# Patient Record
Sex: Male | Born: 1950 | Race: White | Hispanic: No | Smoking: Former smoker
Health system: Southern US, Community
[De-identification: ages and names within clinical notes are randomized; demographics above are authoritative.]

## PROBLEM LIST (undated history)

## (undated) DIAGNOSIS — E785 Hyperlipidemia, unspecified: Secondary | ICD-10-CM

## (undated) DIAGNOSIS — K219 Gastro-esophageal reflux disease without esophagitis: Secondary | ICD-10-CM

## (undated) DIAGNOSIS — G35 Multiple sclerosis: Secondary | ICD-10-CM

## (undated) DIAGNOSIS — G35D Multiple sclerosis, unspecified: Secondary | ICD-10-CM

## (undated) DIAGNOSIS — M1990 Unspecified osteoarthritis, unspecified site: Secondary | ICD-10-CM

## (undated) DIAGNOSIS — T7840XA Allergy, unspecified, initial encounter: Secondary | ICD-10-CM

## (undated) DIAGNOSIS — K5090 Crohn's disease, unspecified, without complications: Secondary | ICD-10-CM

## (undated) DIAGNOSIS — G709 Myoneural disorder, unspecified: Secondary | ICD-10-CM

## (undated) DIAGNOSIS — K5190 Ulcerative colitis, unspecified, without complications: Secondary | ICD-10-CM

## (undated) HISTORY — DX: Allergy, unspecified, initial encounter: T78.40XA

## (undated) HISTORY — PX: CERVICAL SPINE SURGERY: SHX589

## (undated) HISTORY — PX: THUMB ARTHROSCOPY: SHX2509

## (undated) HISTORY — PX: TONSILLECTOMY: SUR1361

## (undated) HISTORY — PX: ELBOW SURGERY: SHX618

## (undated) HISTORY — PX: SPINE SURGERY: SHX786

## (undated) HISTORY — DX: Hyperlipidemia, unspecified: E78.5

## (undated) HISTORY — PX: OTHER SURGICAL HISTORY: SHX169

## (undated) HISTORY — PX: BACK SURGERY: SHX140

---

## 2008-04-08 ENCOUNTER — Encounter: Admission: RE | Admit: 2008-04-08 | Discharge: 2008-04-08 | Payer: Self-pay | Admitting: Family Medicine

## 2008-04-19 ENCOUNTER — Encounter: Admission: RE | Admit: 2008-04-19 | Discharge: 2008-04-19 | Payer: Self-pay | Admitting: Family Medicine

## 2008-07-15 ENCOUNTER — Encounter: Admission: RE | Admit: 2008-07-15 | Discharge: 2008-07-15 | Payer: Self-pay | Admitting: Orthopedic Surgery

## 2008-07-18 ENCOUNTER — Encounter: Admission: RE | Admit: 2008-07-18 | Discharge: 2008-07-18 | Payer: Self-pay | Admitting: Orthopedic Surgery

## 2010-06-06 ENCOUNTER — Encounter: Payer: Self-pay | Admitting: Orthopedic Surgery

## 2012-07-18 ENCOUNTER — Other Ambulatory Visit: Payer: Self-pay | Admitting: Neurological Surgery

## 2012-07-18 DIAGNOSIS — M545 Low back pain, unspecified: Secondary | ICD-10-CM

## 2012-07-22 ENCOUNTER — Ambulatory Visit
Admission: RE | Admit: 2012-07-22 | Discharge: 2012-07-22 | Disposition: A | Discharge status: Home / Self Care | Payer: 59 | Source: Ambulatory Visit | Attending: Neurological Surgery | Admitting: Neurological Surgery

## 2012-07-22 DIAGNOSIS — M545 Low back pain, unspecified: Secondary | ICD-10-CM

## 2012-08-02 ENCOUNTER — Ambulatory Visit: Payer: 59 | Attending: Neurological Surgery | Admitting: Physical Therapy

## 2012-08-02 DIAGNOSIS — M545 Low back pain, unspecified: Secondary | ICD-10-CM | POA: Insufficient documentation

## 2012-08-02 DIAGNOSIS — IMO0001 Reserved for inherently not codable concepts without codable children: Secondary | ICD-10-CM | POA: Insufficient documentation

## 2012-08-02 DIAGNOSIS — M6281 Muscle weakness (generalized): Secondary | ICD-10-CM | POA: Insufficient documentation

## 2012-08-13 ENCOUNTER — Ambulatory Visit: Payer: 59 | Admitting: Physical Therapy

## 2012-08-16 ENCOUNTER — Encounter: Payer: 59 | Admitting: Physical Therapy

## 2012-08-27 ENCOUNTER — Encounter: Payer: 59 | Admitting: Physical Therapy

## 2012-08-30 ENCOUNTER — Encounter: Payer: 59 | Admitting: Physical Therapy

## 2012-09-10 DIAGNOSIS — Z9225 Personal history of immunosupression therapy: Secondary | ICD-10-CM | POA: Insufficient documentation

## 2013-01-24 ENCOUNTER — Other Ambulatory Visit: Payer: Self-pay | Admitting: Neurological Surgery

## 2013-01-24 DIAGNOSIS — M47812 Spondylosis without myelopathy or radiculopathy, cervical region: Secondary | ICD-10-CM

## 2013-01-25 ENCOUNTER — Ambulatory Visit
Admission: RE | Admit: 2013-01-25 | Discharge: 2013-01-25 | Disposition: A | Discharge status: Home / Self Care | Payer: 59 | Source: Ambulatory Visit | Attending: Neurological Surgery | Admitting: Neurological Surgery

## 2013-01-25 DIAGNOSIS — M47812 Spondylosis without myelopathy or radiculopathy, cervical region: Secondary | ICD-10-CM

## 2013-05-22 ENCOUNTER — Ambulatory Visit (HOSPITAL_COMMUNITY): Payer: 59 | Admitting: Licensed Clinical Social Worker

## 2013-06-28 ENCOUNTER — Other Ambulatory Visit: Payer: Self-pay | Admitting: Neurological Surgery

## 2013-07-03 ENCOUNTER — Encounter (HOSPITAL_COMMUNITY): Payer: Self-pay | Admitting: Pharmacy Technician

## 2013-07-05 NOTE — Pre-Procedure Instructions (Addendum)
Paul Fowler  07/05/2013   Your procedure is scheduled on:  Thursday, July 11, 2013 at 1:10 PM   Report to [facility] Short Stay (use Main Entrance "A'') at 11:10 AM.  Call this number if you have problems the morning of surgery: [phone]   Remember:   Do not eat food or drink liquids after midnight.   Take these medicines the morning of surgery with A SIP OF WATER: buPROPion (WELLBUTRIN XL), escitalopram (LEXAPRO), mesalamine (LIALDA), if needed:HYDROcodone-acetaminophen (NORCO) for pain, ranitidine (ZANTAC) for heartburn Stop taking Aspirin, vitamins and herbal medications. Do not take any NSAIDs ie: Ibuprofen, Advil, Naproxen or any medication containing Aspirin. Stop 5 days prior to procedure.  Do not wear jewelry, make-up or nail polish.  Do not wear lotions, powders, or perfumes. You may wear deodorant.  Do not shave 48 hours prior to surgery. Men may shave face and neck.  Do not bring valuables to the hospital.  [facility] is not responsible  for any belongings or valuables.               Contacts, dentures or bridgework may not be worn into surgery.  Leave suitcase in the car. After surgery it may be brought to your room.  For patients admitted to the hospital, discharge time is determined by your treatment team.               Patients discharged the day of surgery will not be allowed to drive home.   Name and phone number of your driver:                                                       Special Instructions: Please read [facility] - Preparing for Surgery

## 2013-07-08 ENCOUNTER — Encounter (HOSPITAL_COMMUNITY)
Admission: RE | Admit: 2013-07-08 | Discharge: 2013-07-08 | Disposition: A | Discharge status: Home / Self Care | Payer: 59 | Source: Ambulatory Visit | Attending: Neurological Surgery | Admitting: Neurological Surgery

## 2013-07-08 ENCOUNTER — Ambulatory Visit (HOSPITAL_COMMUNITY)
Admission: RE | Admit: 2013-07-08 | Discharge: 2013-07-08 | Disposition: A | Discharge status: Home / Self Care | Payer: 59 | Source: Ambulatory Visit | Attending: Neurological Surgery | Admitting: Neurological Surgery

## 2013-07-08 ENCOUNTER — Encounter (HOSPITAL_COMMUNITY): Payer: Self-pay

## 2013-07-08 DIAGNOSIS — Z01818 Encounter for other preprocedural examination: Secondary | ICD-10-CM | POA: Insufficient documentation

## 2013-07-08 DIAGNOSIS — Z01812 Encounter for preprocedural laboratory examination: Secondary | ICD-10-CM | POA: Insufficient documentation

## 2013-07-08 DIAGNOSIS — Q762 Congenital spondylolisthesis: Secondary | ICD-10-CM | POA: Insufficient documentation

## 2013-07-08 HISTORY — DX: Multiple sclerosis: G35

## 2013-07-08 HISTORY — DX: Ulcerative colitis, unspecified, without complications: K51.90

## 2013-07-08 HISTORY — DX: Unspecified osteoarthritis, unspecified site: M19.90

## 2013-07-08 HISTORY — DX: Multiple sclerosis, unspecified: G35.D

## 2013-07-08 HISTORY — DX: Crohn's disease, unspecified, without complications: K50.90

## 2013-07-08 HISTORY — DX: Gastro-esophageal reflux disease without esophagitis: K21.9

## 2013-07-08 HISTORY — DX: Myoneural disorder, unspecified: G70.9

## 2013-07-08 LAB — CBC WITH DIFFERENTIAL/PLATELET
BASOS ABS: 0.1 10*3/uL (ref 0.0–0.1)
Basophils Relative: 1 % (ref 0–1)
EOS ABS: 0.2 10*3/uL (ref 0.0–0.7)
Eosinophils Relative: 3 % (ref 0–5)
HEMATOCRIT: 43.4 % (ref 39.0–52.0)
Hemoglobin: 14.9 g/dL (ref 13.0–17.0)
LYMPHS ABS: 2.1 10*3/uL (ref 0.7–4.0)
Lymphocytes Relative: 30 % (ref 12–46)
MCH: 31.6 pg (ref 26.0–34.0)
MCHC: 34.3 g/dL (ref 30.0–36.0)
MCV: 92.1 fL (ref 78.0–100.0)
MONO ABS: 0.7 10*3/uL (ref 0.1–1.0)
Monocytes Relative: 10 % (ref 3–12)
NEUTROS ABS: 4 10*3/uL (ref 1.7–7.7)
Neutrophils Relative %: 57 % (ref 43–77)
PLATELETS: 250 10*3/uL (ref 150–400)
RBC: 4.71 MIL/uL (ref 4.22–5.81)
RDW: 12.7 % (ref 11.5–15.5)
WBC: 7 10*3/uL (ref 4.0–10.5)

## 2013-07-08 LAB — BASIC METABOLIC PANEL
BUN: 9 mg/dL (ref 6–23)
CO2: 28 mEq/L (ref 19–32)
Calcium: 9.6 mg/dL (ref 8.4–10.5)
Chloride: 101 mEq/L (ref 96–112)
Creatinine, Ser: 0.91 mg/dL (ref 0.50–1.35)
GFR calc Af Amer: >90 mL/min (ref >90)
GFR calc non Af Amer: 90 mL/min — ABNORMAL LOW (ref >90)
GLUCOSE: 86 mg/dL (ref 70–99)
Potassium: 4 mEq/L (ref 3.7–5.3)
Sodium: 141 mEq/L (ref 137–147)

## 2013-07-08 LAB — PROTIME-INR
INR: 0.98 (ref 0.00–1.49)
PROTHROMBIN TIME: 12.8 s (ref 11.6–15.2)

## 2013-07-08 LAB — SURGICAL PCR SCREEN
MRSA, PCR: NEGATIVE
Staphylococcus aureus: NEGATIVE

## 2013-07-08 LAB — TYPE AND SCREEN
ABO/RH(D): O POS
Antibody Screen: NEGATIVE

## 2013-07-08 LAB — ABO/RH: ABO/RH(D): O POS

## 2013-07-08 NOTE — Progress Notes (Addendum)
Patient had Echo, Stress test, ?ekg, and possibly CXR bacin in 2014.  He was having some SOB and wanted to get it checked out.  His PCP is Pearla Dubonnet  [phone].  I have requested that info from this office.  I elected not to do the EKG at PAT, as he thinks he had one alittle while ago. May have to do one  DOS.    DA

## 2013-07-10 MED ORDER — DEXAMETHASONE SODIUM PHOSPHATE 10 MG/ML IJ SOLN
10.0000 mg | INTRAMUSCULAR | Status: AC
  Administered 2013-07-11: 10 mg via INTRAVENOUS
  Filled 2013-07-10: qty 1

## 2013-07-10 MED ORDER — CEFAZOLIN SODIUM-DEXTROSE 2-3 GM-% IV SOLR
2.0000 g | INTRAVENOUS | Status: AC
  Administered 2013-07-11: 2 g via INTRAVENOUS
  Filled 2013-07-10: qty 50

## 2013-07-11 ENCOUNTER — Inpatient Hospital Stay (HOSPITAL_COMMUNITY)
Admission: RE | Admit: 2013-07-11 | Discharge: 2013-07-12 | DRG: 460 | Disposition: A | Discharge status: Home / Self Care | Payer: 59 | Source: Ambulatory Visit | Attending: Neurological Surgery | Admitting: Neurological Surgery

## 2013-07-11 ENCOUNTER — Inpatient Hospital Stay (HOSPITAL_COMMUNITY): Payer: 59

## 2013-07-11 ENCOUNTER — Inpatient Hospital Stay (HOSPITAL_COMMUNITY): Payer: 59 | Admitting: Anesthesiology

## 2013-07-11 ENCOUNTER — Encounter (HOSPITAL_COMMUNITY): Payer: 59 | Admitting: Anesthesiology

## 2013-07-11 ENCOUNTER — Encounter (HOSPITAL_COMMUNITY): Payer: Self-pay | Admitting: Anesthesiology

## 2013-07-11 ENCOUNTER — Encounter (HOSPITAL_COMMUNITY)
Admission: RE | Disposition: A | Discharge status: Home / Self Care | Payer: Self-pay | Source: Ambulatory Visit | Attending: Neurological Surgery

## 2013-07-11 DIAGNOSIS — Z981 Arthrodesis status: Secondary | ICD-10-CM

## 2013-07-11 DIAGNOSIS — M48061 Spinal stenosis, lumbar region without neurogenic claudication: Principal | ICD-10-CM | POA: Diagnosis present

## 2013-07-11 DIAGNOSIS — G35 Multiple sclerosis: Secondary | ICD-10-CM | POA: Diagnosis present

## 2013-07-11 DIAGNOSIS — M129 Arthropathy, unspecified: Secondary | ICD-10-CM | POA: Diagnosis present

## 2013-07-11 DIAGNOSIS — K219 Gastro-esophageal reflux disease without esophagitis: Secondary | ICD-10-CM | POA: Diagnosis present

## 2013-07-11 HISTORY — PX: MAXIMUM ACCESS (MAS)POSTERIOR LUMBAR INTERBODY FUSION (PLIF) 1 LEVEL: SHX6368

## 2013-07-11 SURGERY — FOR MAXIMUM ACCESS (MAS) POSTERIOR LUMBAR INTERBODY FUSION (PLIF) 1 LEVEL
Anesthesia: General | Site: Back

## 2013-07-11 MED ORDER — FENTANYL CITRATE 0.05 MG/ML IJ SOLN
INTRAMUSCULAR | Status: AC
  Filled 2013-07-11: qty 5

## 2013-07-11 MED ORDER — ACETAMINOPHEN 650 MG RE SUPP: 650.0000 mg | RECTAL | Status: DC | PRN

## 2013-07-11 MED ORDER — LACTATED RINGERS IV SOLN
INTRAVENOUS | Status: DC | PRN
  Administered 2013-07-11 (×2): via INTRAVENOUS

## 2013-07-11 MED ORDER — 0.9 % SODIUM CHLORIDE (POUR BTL) OPTIME
TOPICAL | Status: DC | PRN
  Administered 2013-07-11: 1000 mL

## 2013-07-11 MED ORDER — SODIUM CHLORIDE 0.9 % IJ SOLN: 3.0000 mL | INTRAMUSCULAR | Status: DC | PRN

## 2013-07-11 MED ORDER — GLYCOPYRROLATE 0.2 MG/ML IJ SOLN
INTRAMUSCULAR | Status: DC | PRN
  Administered 2013-07-11: .6 mg via INTRAVENOUS

## 2013-07-11 MED ORDER — ROCURONIUM BROMIDE 100 MG/10ML IV SOLN
INTRAVENOUS | Status: DC | PRN
  Administered 2013-07-11: 50 mg via INTRAVENOUS

## 2013-07-11 MED ORDER — PROPOFOL 10 MG/ML IV BOLUS
INTRAVENOUS | Status: DC | PRN
  Administered 2013-07-11: 200 mg via INTRAVENOUS

## 2013-07-11 MED ORDER — ONDANSETRON HCL 4 MG/2ML IJ SOLN
INTRAMUSCULAR | Status: DC | PRN
  Administered 2013-07-11: 4 mg via INTRAVENOUS

## 2013-07-11 MED ORDER — MIDAZOLAM HCL 2 MG/2ML IJ SOLN
INTRAMUSCULAR | Status: AC
  Filled 2013-07-11: qty 2

## 2013-07-11 MED ORDER — ACETAMINOPHEN 325 MG PO TABS: 650.0000 mg | ORAL_TABLET | ORAL | Status: DC | PRN

## 2013-07-11 MED ORDER — SODIUM CHLORIDE 0.9 % IN NEBU
INHALATION_SOLUTION | RESPIRATORY_TRACT | Status: AC
  Filled 2013-07-11: qty 3

## 2013-07-11 MED ORDER — SODIUM CHLORIDE 0.9 % IJ SOLN: 3.0000 mL | Freq: Two times a day (BID) | INTRAMUSCULAR | Status: DC

## 2013-07-11 MED ORDER — MIDAZOLAM HCL 5 MG/5ML IJ SOLN
INTRAMUSCULAR | Status: DC | PRN
  Administered 2013-07-11: 2 mg via INTRAVENOUS

## 2013-07-11 MED ORDER — NEOSTIGMINE METHYLSULFATE 1 MG/ML IJ SOLN
INTRAMUSCULAR | Status: DC | PRN
  Administered 2013-07-11: 3 mg via INTRAVENOUS

## 2013-07-11 MED ORDER — BUPROPION HCL ER (XL) 300 MG PO TB24
300.0000 mg | ORAL_TABLET | Freq: Every day | ORAL | Status: DC
  Administered 2013-07-12: 300 mg via ORAL
  Filled 2013-07-11: qty 1

## 2013-07-11 MED ORDER — OXYCODONE-ACETAMINOPHEN 5-325 MG PO TABS
1.0000 | ORAL_TABLET | ORAL | Status: DC | PRN
  Administered 2013-07-11 – 2013-07-12 (×3): 2 via ORAL
  Filled 2013-07-11 (×3): qty 2

## 2013-07-11 MED ORDER — LIDOCAINE HCL (CARDIAC) 20 MG/ML IV SOLN
INTRAVENOUS | Status: AC
  Filled 2013-07-11: qty 5

## 2013-07-11 MED ORDER — CARISOPRODOL 350 MG PO TABS
350.0000 mg | ORAL_TABLET | Freq: Two times a day (BID) | ORAL | Status: DC
Start: 2013-07-11 — End: 2013-07-12
  Administered 2013-07-11 – 2013-07-12 (×2): 350 mg via ORAL
  Filled 2013-07-11 (×2): qty 1

## 2013-07-11 MED ORDER — MORPHINE SULFATE 2 MG/ML IJ SOLN
1.0000 mg | INTRAMUSCULAR | Status: DC | PRN
  Administered 2013-07-11: 2 mg via INTRAVENOUS
  Administered 2013-07-11: 4 mg via INTRAVENOUS
  Administered 2013-07-11: 2 mg via INTRAVENOUS
  Filled 2013-07-11: qty 2

## 2013-07-11 MED ORDER — HYDROMORPHONE HCL PF 1 MG/ML IJ SOLN
0.2500 mg | INTRAMUSCULAR | Status: DC | PRN
  Administered 2013-07-11 (×4): 0.5 mg via INTRAVENOUS

## 2013-07-11 MED ORDER — SODIUM CHLORIDE 0.9 % IR SOLN
Status: DC | PRN
  Administered 2013-07-11: 14:00:00

## 2013-07-11 MED ORDER — GLYCOPYRROLATE 0.2 MG/ML IJ SOLN
INTRAMUSCULAR | Status: AC
  Filled 2013-07-11: qty 1

## 2013-07-11 MED ORDER — ONDANSETRON HCL 4 MG/2ML IJ SOLN
INTRAMUSCULAR | Status: AC
  Filled 2013-07-11: qty 2

## 2013-07-11 MED ORDER — OXYCODONE HCL 5 MG PO TABS
5.0000 mg | ORAL_TABLET | Freq: Once | ORAL | Status: AC | PRN
  Administered 2013-07-11: 5 mg via ORAL

## 2013-07-11 MED ORDER — OXYCODONE HCL 5 MG PO TABS
ORAL_TABLET | ORAL | Status: AC
  Filled 2013-07-11: qty 1

## 2013-07-11 MED ORDER — LIDOCAINE HCL (CARDIAC) 20 MG/ML IV SOLN
INTRAVENOUS | Status: DC | PRN
  Administered 2013-07-11: 70 mg via INTRAVENOUS

## 2013-07-11 MED ORDER — CELECOXIB 200 MG PO CAPS
200.0000 mg | ORAL_CAPSULE | Freq: Two times a day (BID) | ORAL | Status: DC
  Administered 2013-07-12: 200 mg via ORAL
  Filled 2013-07-11 (×3): qty 1

## 2013-07-11 MED ORDER — POTASSIUM CHLORIDE IN NACL 20-0.9 MEQ/L-% IV SOLN
INTRAVENOUS | Status: DC
  Administered 2013-07-11: 21:00:00 via INTRAVENOUS
  Filled 2013-07-11 (×3): qty 1000

## 2013-07-11 MED ORDER — NEOSTIGMINE METHYLSULFATE 1 MG/ML IJ SOLN
INTRAMUSCULAR | Status: AC
  Filled 2013-07-11: qty 10

## 2013-07-11 MED ORDER — ONDANSETRON HCL 4 MG/2ML IJ SOLN: 4.0000 mg | INTRAMUSCULAR | Status: DC | PRN

## 2013-07-11 MED ORDER — HYDROMORPHONE HCL PF 1 MG/ML IJ SOLN
INTRAMUSCULAR | Status: AC
Start: 2013-07-11 — End: 2013-07-12
  Filled 2013-07-11: qty 1

## 2013-07-11 MED ORDER — ARTIFICIAL TEARS OP OINT
TOPICAL_OINTMENT | OPHTHALMIC | Status: AC
  Filled 2013-07-11: qty 3.5

## 2013-07-11 MED ORDER — MENTHOL 3 MG MT LOZG: 1.0000 | LOZENGE | OROMUCOSAL | Status: DC | PRN

## 2013-07-11 MED ORDER — METOCLOPRAMIDE HCL 5 MG/ML IJ SOLN: 10.0000 mg | Freq: Once | INTRAMUSCULAR | Status: DC | PRN

## 2013-07-11 MED ORDER — SODIUM CHLORIDE 0.9 % IV SOLN: 250.0000 mL | INTRAVENOUS | Status: DC

## 2013-07-11 MED ORDER — LACTATED RINGERS IV SOLN
INTRAVENOUS | Status: DC
  Administered 2013-07-11: 11:00:00 via INTRAVENOUS

## 2013-07-11 MED ORDER — CEFAZOLIN SODIUM 1-5 GM-% IV SOLN
1.0000 g | Freq: Three times a day (TID) | INTRAVENOUS | Status: AC
  Administered 2013-07-11 – 2013-07-12 (×2): 1 g via INTRAVENOUS
  Filled 2013-07-11 (×2): qty 50

## 2013-07-11 MED ORDER — FENTANYL CITRATE 0.05 MG/ML IJ SOLN
INTRAMUSCULAR | Status: DC | PRN
  Administered 2013-07-11: 100 ug via INTRAVENOUS
  Administered 2013-07-11 (×3): 50 ug via INTRAVENOUS

## 2013-07-11 MED ORDER — BUPIVACAINE HCL (PF) 0.25 % IJ SOLN
INTRAMUSCULAR | Status: DC | PRN
Start: 2013-07-11 — End: 2013-07-11
  Administered 2013-07-11: 5 mL

## 2013-07-11 MED ORDER — PROPOFOL 10 MG/ML IV BOLUS
INTRAVENOUS | Status: AC
  Filled 2013-07-11: qty 20

## 2013-07-11 MED ORDER — THROMBIN 5000 UNITS EX SOLR
CUTANEOUS | Status: DC | PRN
  Administered 2013-07-11: 14:00:00 via TOPICAL

## 2013-07-11 MED ORDER — OXYCODONE HCL 5 MG/5ML PO SOLN: 5.0000 mg | Freq: Once | ORAL | Status: AC | PRN

## 2013-07-11 MED ORDER — PHENOL 1.4 % MT LIQD
1.0000 | OROMUCOSAL | Status: DC | PRN
Start: 2013-07-11 — End: 2013-07-12

## 2013-07-11 MED ORDER — THROMBIN 20000 UNITS EX SOLR
CUTANEOUS | Status: DC | PRN
  Administered 2013-07-11: 14:00:00 via TOPICAL

## 2013-07-11 MED ORDER — MORPHINE SULFATE 4 MG/ML IJ SOLN
INTRAMUSCULAR | Status: AC
  Administered 2013-07-11: 2 mg
  Filled 2013-07-11: qty 1

## 2013-07-11 MED ORDER — TRAZODONE HCL 100 MG PO TABS
100.0000 mg | ORAL_TABLET | Freq: Every day | ORAL | Status: DC
  Administered 2013-07-11: 100 mg via ORAL
  Filled 2013-07-11 (×2): qty 1

## 2013-07-11 MED ORDER — DEXAMETHASONE 4 MG PO TABS
4.0000 mg | ORAL_TABLET | Freq: Four times a day (QID) | ORAL | Status: DC
  Administered 2013-07-11 – 2013-07-12 (×3): 4 mg via ORAL
  Filled 2013-07-11 (×6): qty 1

## 2013-07-11 MED ORDER — MESALAMINE 1.2 G PO TBEC
2.4000 g | DELAYED_RELEASE_TABLET | Freq: Every day | ORAL | Status: DC
  Administered 2013-07-12: 2.4 g via ORAL
  Filled 2013-07-11 (×2): qty 2

## 2013-07-11 MED ORDER — HYDROMORPHONE HCL PF 1 MG/ML IJ SOLN
INTRAMUSCULAR | Status: AC
  Filled 2013-07-11: qty 1

## 2013-07-11 MED ORDER — ROCURONIUM BROMIDE 50 MG/5ML IV SOLN
INTRAVENOUS | Status: AC
  Filled 2013-07-11: qty 1

## 2013-07-11 MED ORDER — ESCITALOPRAM OXALATE 10 MG PO TABS
10.0000 mg | ORAL_TABLET | Freq: Every day | ORAL | Status: DC
  Administered 2013-07-12: 10 mg via ORAL
  Filled 2013-07-11: qty 1

## 2013-07-11 MED ORDER — DEXAMETHASONE SODIUM PHOSPHATE 4 MG/ML IJ SOLN
4.0000 mg | Freq: Four times a day (QID) | INTRAMUSCULAR | Status: DC
  Filled 2013-07-11 (×4): qty 1

## 2013-07-11 SURGICAL SUPPLY — 69 items
BAG DECANTER FOR FLEXI CONT (MISCELLANEOUS) ×2 IMPLANT
BENZOIN TINCTURE PRP APPL 2/3 (GAUZE/BANDAGES/DRESSINGS) ×2 IMPLANT
BLADE SURG ROTATE 9660 (MISCELLANEOUS) ×2 IMPLANT
BONE MATRIX OSTEOCEL PRO MED (Bone Implant) ×2 IMPLANT
BUR MATCHSTICK NEURO 3.0 LAGG (BURR) ×2 IMPLANT
CAGE COROENT LG 10X9X23-12 (Cage) ×4 IMPLANT
CANISTER SUCT 3000ML (MISCELLANEOUS) ×2 IMPLANT
CLIP NEUROVISION LG (CLIP) ×2 IMPLANT
CONT SPEC 4OZ CLIKSEAL STRL BL (MISCELLANEOUS) ×4 IMPLANT
COVER BACK TABLE 24X17X13 BIG (DRAPES) IMPLANT
COVER TABLE BACK 60X90 (DRAPES) ×2 IMPLANT
DRAPE C-ARM 42X72 X-RAY (DRAPES) ×2 IMPLANT
DRAPE C-ARMOR (DRAPES) ×2 IMPLANT
DRAPE LAPAROTOMY 100X72X124 (DRAPES) ×2 IMPLANT
DRAPE POUCH INSTRU U-SHP 10X18 (DRAPES) ×2 IMPLANT
DRAPE SURG 17X23 STRL (DRAPES) ×2 IMPLANT
DRESSING TELFA 8X3 (GAUZE/BANDAGES/DRESSINGS) ×2 IMPLANT
DRSG OPSITE 4X5.5 SM (GAUZE/BANDAGES/DRESSINGS) ×4 IMPLANT
DURAPREP 26ML APPLICATOR (WOUND CARE) ×2 IMPLANT
ELECT REM PT RETURN 9FT ADLT (ELECTROSURGICAL) ×2
ELECTRODE REM PT RTRN 9FT ADLT (ELECTROSURGICAL) ×1 IMPLANT
EVACUATOR 1/8 PVC DRAIN (DRAIN) ×2 IMPLANT
GAUZE SPONGE 4X4 16PLY XRAY LF (GAUZE/BANDAGES/DRESSINGS) IMPLANT
GLOVE BIO SURGEON STRL SZ8 (GLOVE) ×2 IMPLANT
GLOVE BIOGEL M 8.0 STRL (GLOVE) ×2 IMPLANT
GLOVE BIOGEL PI IND STRL 7.5 (GLOVE) ×2 IMPLANT
GLOVE BIOGEL PI IND STRL 8 (GLOVE) ×2 IMPLANT
GLOVE BIOGEL PI INDICATOR 7.5 (GLOVE) ×2
GLOVE BIOGEL PI INDICATOR 8 (GLOVE) ×2
GLOVE ECLIPSE 7.5 STRL STRAW (GLOVE) ×10 IMPLANT
GLOVE EXAM NITRILE MD LF STRL (GLOVE) ×2 IMPLANT
GOWN BRE IMP SLV AUR LG STRL (GOWN DISPOSABLE) IMPLANT
GOWN BRE IMP SLV AUR XL STRL (GOWN DISPOSABLE) IMPLANT
GOWN STRL REIN 2XL LVL4 (GOWN DISPOSABLE) IMPLANT
GOWN STRL REUS W/ TWL LRG LVL3 (GOWN DISPOSABLE) ×1 IMPLANT
GOWN STRL REUS W/ TWL XL LVL3 (GOWN DISPOSABLE) ×3 IMPLANT
GOWN STRL REUS W/TWL 2XL LVL3 (GOWN DISPOSABLE) ×4 IMPLANT
GOWN STRL REUS W/TWL LRG LVL3 (GOWN DISPOSABLE) ×1
GOWN STRL REUS W/TWL XL LVL3 (GOWN DISPOSABLE) ×3
HEMOSTAT POWDER KIT SURGIFOAM (HEMOSTASIS) IMPLANT
KIT BASIN OR (CUSTOM PROCEDURE TRAY) ×2 IMPLANT
KIT NEEDLE NVM5 EMG ELECT (KITS) ×1 IMPLANT
KIT NEEDLE NVM5 EMG ELECTRODE (KITS) ×1
KIT ROOM TURNOVER OR (KITS) ×2 IMPLANT
MILL MEDIUM DISP (BLADE) ×2 IMPLANT
NEEDLE HYPO 25X1 1.5 SAFETY (NEEDLE) ×2 IMPLANT
NS IRRIG 1000ML POUR BTL (IV SOLUTION) ×2 IMPLANT
PACK LAMINECTOMY NEURO (CUSTOM PROCEDURE TRAY) ×4 IMPLANT
PAD ARMBOARD 7.5X6 YLW CONV (MISCELLANEOUS) ×6 IMPLANT
ROD 30MM (Rod) ×2 IMPLANT
ROD PLIF MAS PB SPHERX 30 (Rod) ×2 IMPLANT
SCREW LOCK (Screw) ×4 IMPLANT
SCREW LOCK FXNS SPNE MAS PL (Screw) ×4 IMPLANT
SCREW PLIF MAS 5.0X35 (Screw) ×4 IMPLANT
SCREW SHANK 5.0X35 (Screw) ×4 IMPLANT
SCREW TULIP 5.5 (Screw) ×4 IMPLANT
SPONGE LAP 4X18 X RAY DECT (DISPOSABLE) IMPLANT
SPONGE SURGIFOAM ABS GEL 100 (HEMOSTASIS) ×2 IMPLANT
STRIP CLOSURE SKIN 1/2X4 (GAUZE/BANDAGES/DRESSINGS) ×2 IMPLANT
SUT VIC AB 0 CT1 18XCR BRD8 (SUTURE) ×1 IMPLANT
SUT VIC AB 0 CT1 8-18 (SUTURE) ×1
SUT VIC AB 2-0 CP2 18 (SUTURE) ×2 IMPLANT
SUT VIC AB 3-0 SH 8-18 (SUTURE) ×2 IMPLANT
SYR 20ML ECCENTRIC (SYRINGE) ×2 IMPLANT
SYR 3ML LL SCALE MARK (SYRINGE) IMPLANT
TOWEL OR 17X24 6PK STRL BLUE (TOWEL DISPOSABLE) ×2 IMPLANT
TOWEL OR 17X26 10 PK STRL BLUE (TOWEL DISPOSABLE) ×2 IMPLANT
TRAY FOLEY CATH 14FRSI W/METER (CATHETERS) ×2 IMPLANT
WATER STERILE IRR 1000ML POUR (IV SOLUTION) ×2 IMPLANT

## 2013-07-11 NOTE — Anesthesia Postprocedure Evaluation (Signed)
Anesthesia Post Note  Patient: Paul Fowler  Procedure(s) Performed: Procedure(s) (LRB): FOR MAXIMUM ACCESS (MAS) POSTERIOR LUMBAR INTERBODY FUSION (PLIF) 1 LEVEL LUMBAR FOUR-FIVE (N/A)  Anesthesia type: General  Patient location: PACU  Post pain: Pain level controlled and Adequate analgesia  Post assessment: Post-op Vital signs reviewed, Patient's Cardiovascular Status Stable, Respiratory Function Stable, Patent Airway and Pain level controlled  Last Vitals:  Filed Vitals:   07/11/13 1600  BP:   Pulse: 66  Temp:   Resp: 18    Post vital signs: Reviewed and stable  Level of consciousness: awake, alert  and oriented  Complications: No apparent anesthesia complications

## 2013-07-11 NOTE — Progress Notes (Signed)
Rec'd report from Nickie Retort, assuming care of patient at this time

## 2013-07-11 NOTE — Op Note (Signed)
07/11/2013  3:45 PM  PATIENT:  Paul Fowler  63 y.o. male  PRE-OPERATIVE DIAGNOSIS:  L4-5 spondylolisthesis with spinal stenosis, back and leg pain  POST-OPERATIVE DIAGNOSIS:  Same  PROCEDURE:   1. Decompressive lumbar laminectomy L4-5 requiring more work than would be required for a simple exposure of the disk for PLIF in order to adequately decompress the neural elements and address the spinal stenosis 2. Posterior lumbar interbody fusion L4-5 using PEEK interbody cages packed with morcellized allograft and autograft 3. Posterior fixation L4-5 using cortical pedicle screws.   SURGEON:  Marikay Alar, MD  ASSISTANTS: Dr. Jeral Fruit  ANESTHESIA:  General  EBL: 155 ml  Total I/O In: 1000 [I.V.:1000] Out: 305 [Urine:150; Blood:155]  BLOOD ADMINISTERED:none  DRAINS: Hemovac   INDICATION FOR PROCEDURE: This patient presented with a long history of back and leg pain. MRI showed grade 1 spondylolisthesis with stenosis at L4-5. He tried medical management for quite some time without relief. Decompression and instrumented fusion at L4-5 to address the segmental instability and spinal stenosis. Patient understood the risks, benefits, and alternatives and potential outcomes and wished to proceed.  PROCEDURE DETAILS:  The patient was brought to the operating room. After induction of generalized endotracheal anesthesia the patient was rolled into the prone position on chest rolls and all pressure points were padded. The patient's lumbar region was cleaned and then prepped with DuraPrep and draped in the usual sterile fashion. Anesthesia was injected and then a dorsal midline incision was made and carried down to the lumbosacral fascia. The fascia was opened and the paraspinous musculature was taken down in a subperiosteal fashion to expose the L4-5. A self-retaining retractor was placed. Intraoperative fluoroscopy confirmed my level, and I started with placement of the L4 cortical pedicle screws.  The pedicle screw entry zones were identified utilizing surface landmarks and  AP and lateral fluoroscopy. I scored the cortex with the high-speed drill and then used the hand drill and EMG monitoring to drill an upward and outward direction into the pedicle. I then tapped line to line, and the tap was also monitored. I then placed a 5-0 by 35 mm cortical pedicle screw into the pedicles of L4 bilaterally. I then turned my attention to the decompression and the spinous process was removed and complete lumbar laminectomies, hemi- facetectomies, and foraminotomies were performed at L4-5. The patient had significant spinal stenosis and this required more work than would be required for a simple exposure of the disc for posterior lumbar interbody fusion. Much more generous decompression was undertaken in order to adequately decompress the neural elements and address the patient's leg pain. The yellow ligament was removed to expose the underlying dura and nerve roots, and generous foraminotomies were performed to adequately decompress the neural elements. Both the exiting and traversing nerve roots were decompressed on both sides until a coronary dilator passed easily along the nerve roots. Once the decompression was complete, I turned my attention to the posterior lower lumbar interbody fusion. The epidural venous vasculature was coagulated and cut sharply. Disc space was incised and the initial discectomy was performed with pituitary rongeurs. The disc space was distracted with sequential distractors to a height of 10 mm. We then used a series of scrapers and shavers to prepare the endplates for fusion. The midline was prepared with Epstein curettes. Once the complete discectomy was finished, we packed an appropriate sized peek interbody cage (10 mm x 23 mm x 12 lordotic) with local autograft and morcellized allograft, gently retracted  the nerve root, and tapped the cage into position at L4-5.  The midline between the  cages was packed with morselized autograft and allograft. We then turned our attention to the placement of the lower pedicle screws. The pedicle screw entry zones were identified utilizing surface landmarks and fluoroscopy. I drilled into each pedicle utilizing the hand drill and EMG monitoring, and tapped each pedicle with the appropriate tap. We palpated with a ball probe to assure no break in the cortex. We then placed 5 0 x 35 mm cortical pedicle screws into the pedicles bilaterally at L5. We then placed lordotic rods into the multiaxial screw heads of the pedicle screws and locked these in position with the locking caps and anti-torque device. We then checked our construct with AP and lateral fluoroscopy. Irrigated with copious amounts of bacitracin-containing saline solution. Placed a medium Hemovac drain through separate stab incision. Inspected the nerve roots once again to assure adequate decompression, lined to the dura with Gelfoam, and closed the muscle and the fascia with 0 Vicryl. Closed the subcutaneous tissues with 2-0 Vicryl and subcuticular tissues with 3-0 Vicryl. The skin was closed with benzoin and Steri-Strips. Dressing was then applied, the patient was awakened from general anesthesia and transported to the recovery room in stable condition. At the end of the procedure all sponge, needle and instrument counts were correct.   PLAN OF CARE: Admit to inpatient   PATIENT DISPOSITION:  PACU - hemodynamically stable.   Delay start of Pharmacological VTE agent (>24hrs) due to surgical blood loss or risk of bleeding:  yes

## 2013-07-11 NOTE — Preoperative (Signed)
Beta Blockers   Reason not to administer Beta Blockers:Not Applicable 

## 2013-07-11 NOTE — H&P (Signed)
Subjective: Patient is a 63 y.o. male admitted for PLIF L4-5 for spondylolisthesis with stenosis. Onset of symptoms was several months ago, gradually worsening since that time.  The pain is rated severe, and is located at the across the lower back and radiates to legs. The pain is described as aching and occurs intermittently. The symptoms have been progressive. Symptoms are exacerbated by exercise and standing. MRI or CT showed spondylolisthesis with stenosis L4-5.   Past Medical History  Diagnosis Date  . GERD (gastroesophageal reflux disease)     occasionally has indigestion  . Arthritis   . Crohn's disease   . Ulcerative colitis     LAST FLAREUP SPRING 2014  . Multiple sclerosis     DX 3-4 YRS AGO-- ASYMPTOMATIC  . Neuromuscular disorder     MS  ON NO MEDS     Past Surgical History  Procedure Laterality Date  . Tonsillectomy    . Torn rotator      LEFT SHOULDER  . Elbow surgery      LEFT  . Thumb arthroscopy      LEFT  . Cervical spine surgery      FUSION    Prior to Admission medications   Medication Sig Start Date End Date Taking? Authorizing Provider  B Complex-Folic Acid (B COMPLEX-VITAMIN B12 PO) Take 1 mL by mouth daily.   Yes Historical Provider, MD  buPROPion (WELLBUTRIN XL) 300 MG 24 hr tablet Take 300 mg by mouth daily.   Yes Historical Provider, MD  carisoprodol (SOMA) 350 MG tablet Take 350 mg by mouth 2 (two) times daily.   Yes Historical Provider, MD  escitalopram (LEXAPRO) 10 MG tablet Take 10 mg by mouth daily.   Yes Historical Provider, MD  HYDROcodone-acetaminophen (NORCO) 7.5-325 MG per tablet Take 2 tablets by mouth every 6 (six) hours as needed for moderate pain.   Yes Historical Provider, MD  mesalamine (LIALDA) 1.2 G EC tablet Take 2.4 g by mouth daily with breakfast.   Yes Historical Provider, MD  Multiple Vitamins-Minerals (MULTIVITAMIN WITH MINERALS) tablet Take 1 tablet by mouth daily.   Yes Historical Provider, MD  ranitidine (ZANTAC) 150 MG  tablet Take 150 mg by mouth daily as needed for heartburn.   Yes Historical Provider, MD  senna-docusate (SENOKOT-S) 8.6-50 MG per tablet Take 2 tablets by mouth 2 (two) times daily as needed for mild constipation.   Yes Historical Provider, MD  traZODone (DESYREL) 100 MG tablet Take 100 mg by mouth at bedtime.   Yes Historical Provider, MD   No Known Allergies  History  Substance Use Topics  . Smoking status: Never Smoker   . Smokeless tobacco: Never Used  . Alcohol Use: No    No family history on file.   Review of Systems  Positive ROS: neg  All other systems have been reviewed and were otherwise negative with the exception of those mentioned in the HPI and as above.  Objective: Vital signs in last 24 hours: Temp:  [97.7 F (36.5 C)] 97.7 F (36.5 C) (02/26 1052) Pulse Rate:  [68] 68 (02/26 1052) Resp:  [20] 20 (02/26 1052) BP: (138)/(86) 138/86 mmHg (02/26 1052) SpO2:  [99 %] 99 % (02/26 1052)  General Appearance: Alert, cooperative, no distress, appears stated age Head: Normocephalic, without obvious abnormality, atraumatic Eyes: PERRL, conjunctiva/corneas clear, EOM's intact    Neck: Supple, symmetrical, trachea midline Back: Symmetric, no curvature, ROM normal, no CVA tenderness Lungs:  respirations unlabored Heart: Regular rate and rhythm Abdomen:  Soft, non-tender Extremities: Extremities normal, atraumatic, no cyanosis or edema Pulses: 2+ and symmetric all extremities Skin: Skin color, texture, turgor normal, no rashes or lesions  NEUROLOGIC:   Mental status: Alert and oriented x4,  no aphasia, good attention span, fund of knowledge, and memory Motor Exam - grossly normal Sensory Exam - grossly normal Reflexes: 1+ Coordination - grossly normal Gait - grossly normal Balance - grossly normal Cranial Nerves: I: smell Not tested  II: visual acuity  OS: nl    OD: nl  II: visual fields Full to confrontation  II: pupils Equal, round, reactive to light  III,VII:  ptosis None  III,IV,VI: extraocular muscles  Full ROM  V: mastication Normal  V: facial light touch sensation  Normal  V,VII: corneal reflex  Present  VII: facial muscle function - upper  Normal  VII: facial muscle function - lower Normal  VIII: hearing Not tested  IX: soft palate elevation  Normal  IX,X: gag reflex Present  XI: trapezius strength  5/5  XI: sternocleidomastoid strength 5/5  XI: neck flexion strength  5/5  XII: tongue strength  Normal    Data Review Lab Results  Component Value Date   WBC 7.0 07/08/2013   HGB 14.9 07/08/2013   HCT 43.4 07/08/2013   MCV 92.1 07/08/2013   PLT 250 07/08/2013   Lab Results  Component Value Date   NA 141 07/08/2013   K 4.0 07/08/2013   CL 101 07/08/2013   CO2 28 07/08/2013   BUN 9 07/08/2013   CREATININE 0.91 07/08/2013   GLUCOSE 86 07/08/2013   Lab Results  Component Value Date   INR 0.98 07/08/2013    Assessment/Plan: Patient admitted for PLIF L4-5 for spondylolisthesis with stenosis. Patient has failed a reasonable attempt at conservative therapy.  I explained the condition and procedure to the patient and answered any questions.  Patient wishes to proceed with procedure as planned. Understands risks/ benefits and typical outcomes of procedure.   Chae Oommen S 07/11/2013 12:21 PM

## 2013-07-11 NOTE — Transfer of Care (Signed)
Immediate Anesthesia Transfer of Care Note  Patient: Paul Fowler  Procedure(s) Performed: Procedure(s): FOR MAXIMUM ACCESS (MAS) POSTERIOR LUMBAR INTERBODY FUSION (PLIF) 1 LEVEL LUMBAR FOUR-FIVE (N/A)  Patient Location: PACU  Anesthesia Type:General  Level of Consciousness: awake, alert  and oriented  Airway & Oxygen Therapy: Patient Spontanous Breathing and Patient connected to nasal cannula oxygen  Post-op Assessment: Report given to PACU RN and Post -op Vital signs reviewed and stable  Post vital signs: Reviewed and stable  Complications: No apparent anesthesia complications

## 2013-07-11 NOTE — Anesthesia Procedure Notes (Signed)
Procedure Name: Intubation Date/Time: 07/11/2013 1:05 PM Performed by: Gwenyth Allegra Pre-anesthesia Checklist: Emergency Drugs available, Timeout performed, Patient identified, Suction available and Patient being monitored Patient Re-evaluated:Patient Re-evaluated prior to inductionOxygen Delivery Method: Circle system utilized Preoxygenation: Pre-oxygenation with 100% oxygen Intubation Type: IV induction Ventilation: Mask ventilation without difficulty and Oral airway inserted - appropriate to patient size Laryngoscope Size: Mac and 4 Grade View: Grade I Tube type: Oral Tube size: 7.5 mm Number of attempts: 1 Airway Equipment and Method: Stylet Secured at: 22 cm Tube secured with: Tape Dental Injury: Teeth and Oropharynx as per pre-operative assessment

## 2013-07-11 NOTE — Anesthesia Preprocedure Evaluation (Signed)
Anesthesia Evaluation  Patient identified by MRN, date of birth, ID band Patient awake    Reviewed: Allergy & Precautions, H&P , NPO status , Patient's Chart, lab work & pertinent test results, reviewed documented beta blocker date and time   Airway Mallampati: II TM Distance: >3 FB Neck ROM: full    Dental   Pulmonary neg pulmonary ROS,  breath sounds clear to auscultation        Cardiovascular negative cardio ROS  Rhythm:regular     Neuro/Psych  Neuromuscular disease negative psych ROS   GI/Hepatic Neg liver ROS, PUD, GERD-  Medicated and Controlled,  Endo/Other  negative endocrine ROS  Renal/GU negative Renal ROS  negative genitourinary   Musculoskeletal   Abdominal   Peds  Hematology negative hematology ROS (+)   Anesthesia Other Findings See surgeon's H&P   Reproductive/Obstetrics negative OB ROS                           Anesthesia Physical Anesthesia Plan  ASA: II  Anesthesia Plan: General   Post-op Pain Management:    Induction: Intravenous  Airway Management Planned: Oral ETT  Additional Equipment:   Intra-op Plan:   Post-operative Plan: Extubation in OR  Informed Consent: I have reviewed the patients History and Physical, chart, labs and discussed the procedure including the risks, benefits and alternatives for the proposed anesthesia with the patient or authorized representative who has indicated his/her understanding and acceptance.   Dental Advisory Given  Plan Discussed with: CRNA and Surgeon  Anesthesia Plan Comments:         Anesthesia Quick Evaluation

## 2013-07-12 ENCOUNTER — Encounter (HOSPITAL_COMMUNITY): Payer: Self-pay | Admitting: Neurological Surgery

## 2013-07-12 MED ORDER — OXYCODONE-ACETAMINOPHEN 5-325 MG PO TABS: 1.0000 | ORAL_TABLET | Freq: Four times a day (QID) | ORAL | Status: DC | PRN

## 2013-07-12 MED ORDER — CARISOPRODOL 350 MG PO TABS: 350.0000 mg | ORAL_TABLET | Freq: Three times a day (TID) | ORAL | Status: DC

## 2013-07-12 NOTE — Evaluation (Signed)
Occupational Therapy Evaluation Patient Details Name: Paul NossKenneth T Tecson MRN: [id number] DOB: [birth year] Today's Date: 07/12/2013 Time: [id number] OT Time Calculation (min): 20 min  OT Assessment / Plan / Recommendation History of present illness PLIF L4-5.   Clinical Impression   Pt admitted with above.  Education completed. Pt is at overall mod I level with mobility and ADLs.  Educated pt on ADL techniques and use of AE to help maintain back precautions.  No further acute OT services needed.     OT Assessment  Patient does not need any further OT services    Follow Up Recommendations  No OT follow up    Barriers to Discharge      Equipment Recommendations  None recommended by OT    Recommendations for Other Services    Frequency       Precautions / Restrictions Precautions Precautions: Back Required Braces or Orthoses: Spinal Brace Spinal Brace: Lumbar corset;Applied in sitting position   Pertinent Vitals/Pain See vitals    ADL  Upper Body Dressing: Performed;Modified independent Where Assessed - Upper Body Dressing: Unsupported sitting Lower Body Dressing: Performed;Modified independent Where Assessed - Lower Body Dressing: Unsupported sit to stand Toilet Transfer: Simulated;Modified independent Toilet Transfer Method: Sit to Baristastand Toilet Transfer Equipment:  (bed) Equipment Used: Back brace Transfers/Ambulation Related to ADLs: mod I ADL Comments: Pt ambulating in room on OT arrival.  Pt had just completed toileting and grooming tasks in bathroom. Educated pt on precautions and ADL techniques to maintain precautions.  Recommended use of cups at sink during grooming in order to avoid bending while brushing teeth.  Pt with questions about how to perform back peri care (unsure if this will be difficult since he has not had BM yet).  Educated pt on use of toilet aid (tongs) to perform toileting hygiene and acquisition of toilet aid, and pt verbalized understanding.     OT  Diagnosis:    OT Problem List:   OT Treatment Interventions:     OT Goals(Current goals can be found in the care plan section)    Visit Information  Last OT Received On: 07/12/13 Assistance Needed: +1 History of Present Illness: PLIF L4-5.       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 Transfers Overall transfer level: Modified independent Equipment used: None General transfer comment: Incr time     Exercise     Balance Balance Overall balance assessment: Independent   End of Session OT - End of Session Equipment Utilized During Treatment: Back brace Activity Tolerance: Patient tolerated treatment well Patient left: in bed;with call bell/phone within reach Nurse Communication: Mobility status  GO    07/12/2013 Cipriano MileJohnson, Stephanee Barcomb Elizabeth OTR/L Pager [phone] Office [phone]  Cipriano MileJohnson, Jonette Wassel Elizabeth 07/12/2013,  10:23 AM

## 2013-07-12 NOTE — Evaluation (Signed)
Physical Therapy Evaluation Patient Details Name: Paul NossKenneth T Fowler MRN: [id number] DOB: Aug 25, 1950 Today's Date: 07/12/2013 Time: 4098-11910915-0930 PT Time Calculation (min): 15 min  PT Assessment / Plan / Recommendation History of Present Illness  PLIF L4-5.  Clinical Impression  Pt doing well with mobility and no further PT needed.  Ready for dc from PT standpoint. Instructed pt to keep amb at home. Instructed pt in minisquats and calf raises.      PT Assessment  Patent does not need any further PT services    Follow Up Recommendations  No PT follow up    Does the patient have the potential to tolerate intense rehabilitation      Barriers to Discharge        Equipment Recommendations  None recommended by PT    Recommendations for Other Services     Frequency      Precautions / Restrictions Precautions Precautions: Back Required Braces or Orthoses: Spinal Brace Spinal Brace: Lumbar corset;Applied in sitting position   Pertinent Vitals/Pain Pt reports incisional soreness.      Mobility  Bed Mobility Overal bed mobility: Modified Independent General bed mobility comments: Instructed in technique to follow back precautions. Transfers Overall transfer level: Modified independent Equipment used: None General transfer comment: Incr time Ambulation/Gait Ambulation/Gait assistance: Independent Ambulation Distance (Feet): 600 Feet Gait Pattern/deviations: WFL(Within Functional Limits) Gait velocity: normal for age    Exercises     PT Diagnosis:    PT Problem List:   PT Treatment Interventions:       PT Goals(Current goals can be found in the care plan section) Acute Rehab PT Goals PT Goal Formulation: No goals set, d/c therapy  Visit Information  Last PT Received On: 07/12/13 Assistance Needed: +1 History of Present Illness: PLIF L4-5.       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    Copywriter, advertisingCognition  Cognition Arousal/Alertness: Awake/alert Behavior During Therapy: WFL for tasks assessed/performed Overall Cognitive Status: Within Functional Limits for tasks assessed    Extremity/Trunk Assessment Upper Extremity Assessment Upper Extremity Assessment: Defer to OT evaluation Lower Extremity Assessment Lower Extremity Assessment: Overall WFL for tasks assessed   Balance Balance Overall balance assessment: Independent  End of Session PT - End of Session Equipment Utilized During Treatment: Back brace Activity Tolerance: Patient tolerated treatment well Patient left: Other (comment) (in bathroom) Nurse Communication: Mobility status  GP     [facility] Health CenterMAYCOCK,Paul Fowler 07/12/2013, 9:40 AM  [facility] Hospital[facility] Paul Fowler PT [phone]

## 2013-07-12 NOTE — Progress Notes (Signed)
Pt. Alert and oriented, follows simple instructions, denies pain. Incision area without swelling, redness or S/S of infection. Voiding adequate clear yellow urine. Moving all extremities well and vitals stable and documented. Lumbar Fusion surgery notes instructions given to patient and family member for home safety and precautions. Pt. and family stated understanding of instructions given.

## 2013-07-12 NOTE — Discharge Summary (Signed)
Physician Discharge Summary  Patient ID: Paul NossKenneth T Fowler MRN: [id number] DOB/AGE: 62/18/1952 62 y.o.  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 The patient tolerated the procedure well and was taken to the recovery room and then to the floor in stable condition. The hospital course was routine. There were no complications. The wound remained clean dry and intact. Pt had appropriate back soreness. No complaints of leg pain or new N/T/W. The patient remained afebrile with stable vital signs, and tolerated a regular diet. The patient continued to increase activities, and pain was well controlled with oral pain medications.   Consults: None  Significant Diagnostic Studies:  Results for orders placed during the hospital encounter of 07/08/13  SURGICAL PCR SCREEN      Result Value Ref Range   MRSA, PCR NEGATIVE  NEGATIVE   Staphylococcus aureus NEGATIVE  NEGATIVE  BASIC METABOLIC PANEL      Result Value Ref Range   Sodium 141  137 - 147 mEq/L   Potassium 4.0  3.7 - 5.3 mEq/L   Chloride 101  96 - 112 mEq/L   CO2 28  19 - 32 mEq/L   Glucose, Bld 86  70 - 99 mg/dL   BUN 9  6 - 23 mg/dL   Creatinine, Ser 6.960.91  0.50 - 1.35 mg/dL   Calcium 9.6  8.4 - 29.510.5 mg/dL   GFR calc non Af Amer 90 (*) >90 mL/min   GFR calc Af Amer >90  >90 mL/min  CBC WITH DIFFERENTIAL      Result Value Ref Range   WBC 7.0  4.0 - 10.5 K/uL   RBC 4.71  4.22 - 5.81 MIL/uL   Hemoglobin 14.9  13.0 - 17.0 g/dL   HCT 28.443.4  13.239.0 - 44.052.0 %   MCV 92.1  78.0 - 100.0 fL   MCH 31.6  26.0 - 34.0 pg   MCHC 34.3  30.0 - 36.0 g/dL   RDW 10.212.7  72.511.5 - 36.615.5 %   Platelets 250  150 - 400 K/uL   Neutrophils Relative % 57  43 - 77 %   Neutro Abs 4.0  1.7 - 7.7 K/uL   Lymphocytes  Relative 30  12 - 46 %   Lymphs Abs 2.1  0.7 - 4.0 K/uL   Monocytes Relative 10  3 - 12 %   Monocytes Absolute 0.7  0.1 - 1.0 K/uL   Eosinophils Relative 3  0 - 5 %   Eosinophils Absolute 0.2  0.0 - 0.7 K/uL   Basophils Relative 1  0 - 1 %   Basophils Absolute 0.1  0.0 - 0.1 K/uL  PROTIME-INR      Result Value Ref Range   Prothrombin Time 12.8  11.6 - 15.2 seconds   INR 0.98  0.00 - 1.49  TYPE AND SCREEN      Result Value Ref Range   ABO/RH(D) O POS     Antibody Screen NEG     Sample Expiration 07/22/2013    ABO/RH      Result Value Ref Range   ABO/RH(D) O POS      Chest 2 View  07/08/2013   CLINICAL DATA:  spondylolisthesis / pre-op  EXAM: CHEST  2 VIEW  COMPARISON:  None.  FINDINGS: The heart  size and mediastinal contours are within normal limits. Both lungs are clear. The visualized skeletal structures are unremarkable.  IMPRESSION: No active cardiopulmonary disease.   Electronically Signed   By: Salome Holmes M.D.   On: 07/08/2013 12:00   Dg Lumbar Spine 2-3 Views  07/11/2013   CLINICAL DATA:  L4-5 fusion.  EXAM: LUMBAR SPINE - 2-3 VIEW; DG C-ARM 1-60 MIN  COMPARISON:  Plain films lumbar spine 07/24/2012.  FINDINGS: 2 fluoroscopic intraoperative spot views of the lumbar spine demonstrate pedicle screws and stabilization bars with interbody spacer in place at L4-5. No acute abnormality is identified.  IMPRESSION: L4-5 fusion.   Electronically Signed   By: Drusilla Kanner M.D.   On: 07/11/2013 16:11   Dg C-arm 1-60 Min  07/11/2013   CLINICAL DATA:  L4-5 fusion.  EXAM: LUMBAR SPINE - 2-3 VIEW; DG C-ARM 1-60 MIN  COMPARISON:  Plain films lumbar spine 07/24/2012.  FINDINGS: 2 fluoroscopic intraoperative spot views of the lumbar spine demonstrate pedicle screws and stabilization bars with interbody spacer in place at L4-5. No acute abnormality is identified.  IMPRESSION: L4-5 fusion.   Electronically Signed   By: Drusilla Kanner M.D.   On: 07/11/2013 16:11    Antibiotics:   Anti-infectives   Start     Dose/Rate Route Frequency Ordered Stop   07/11/13 2100  ceFAZolin (ANCEF) IVPB 1 g/50 mL premix     1 g 100 mL/hr over 30 Minutes Intravenous Every 8 hours 07/11/13 1906 07/12/13 0539   07/11/13 1346  bacitracin 50,000 Units in sodium chloride irrigation 0.9 % 500 mL irrigation  Status:  Discontinued       As needed 07/11/13 1352 07/11/13 1547   07/11/13 0600  ceFAZolin (ANCEF) IVPB 2 g/50 mL premix     2 g 100 mL/hr over 30 Minutes Intravenous On call to O.R. 07/10/13 1408 07/11/13 1300      Discharge Exam: Blood pressure 106/65, pulse 75, temperature 98.8 F (37.1 C), temperature source Oral, resp. rate 18, SpO2 95.00%. Neurologic: Grossly normal Incision clean dry and intact  Discharge Medications:     Medication List    STOP taking these medications       HYDROcodone-acetaminophen 7.5-325 MG per tablet  Commonly known as:  NORCO      TAKE these medications       B COMPLEX-VITAMIN B12 PO  Take 1 mL by mouth daily.     buPROPion 300 MG 24 hr tablet  Commonly known as:  WELLBUTRIN XL  Take 300 mg by mouth daily.     carisoprodol 350 MG tablet  Commonly known as:  SOMA  Take 1 tablet (350 mg total) by mouth 3 (three) times daily.     escitalopram 10 MG tablet  Commonly known as:  LEXAPRO  Take 10 mg by mouth daily.     LIALDA 1.2 G EC tablet  Generic drug:  mesalamine  Take 2.4 g by mouth daily with breakfast.     multivitamin with minerals tablet  Take 1 tablet by mouth daily.     oxyCODONE-acetaminophen 5-325 MG per tablet  Commonly known as:  PERCOCET/ROXICET  Take 1-2 tablets by mouth every 6 (six) hours as needed for moderate pain.     ranitidine 150 MG tablet  Commonly known as:  ZANTAC  Take 150 mg by mouth daily as needed for heartburn.     senna-docusate 8.6-50 MG per tablet  Commonly known as:  Senokot-S  Take 2 tablets by mouth 2 (  two) times daily as needed for mild constipation.     traZODone 100 MG tablet   Commonly known as:  DESYREL  Take 100 mg by mouth at bedtime.        Disposition: Home   Final Dx: PLIF L4-5      Discharge Orders   Future Orders Complete By Expires   Call MD for:  difficulty breathing, headache or visual disturbances  As directed    Call MD for:  persistant nausea and vomiting  As directed    Call MD for:  redness, tenderness, or signs of infection (pain, swelling, redness, odor or green/yellow discharge around incision site)  As directed    Call MD for:  severe uncontrolled pain  As directed    Call MD for:  temperature >100.4  As directed    Diet - low sodium heart healthy  As directed    Discharge instructions  As directed    Comments:     No strenuous activity, no prolonged sitting, no driving, no bending twisting or heavy lifting, may shower normally   Increase activity slowly  As directed       Follow-up Information   Follow up with Kamareon Sciandra S, MD In 2 weeks.   Specialty:  Neurosurgery   Contact information:   [address] [place] [address] [phone]        Signed: Tia Alert 07/12/2013, 8:37 AM

## 2014-03-05 DIAGNOSIS — F32A Depression, unspecified: Secondary | ICD-10-CM | POA: Insufficient documentation

## 2014-03-05 DIAGNOSIS — F339 Major depressive disorder, recurrent, unspecified: Secondary | ICD-10-CM | POA: Insufficient documentation

## 2014-03-05 HISTORY — DX: Major depressive disorder, recurrent, unspecified: F33.9

## 2014-04-22 ENCOUNTER — Other Ambulatory Visit: Payer: Self-pay | Admitting: Neurological Surgery

## 2014-04-22 DIAGNOSIS — M47812 Spondylosis without myelopathy or radiculopathy, cervical region: Secondary | ICD-10-CM

## 2014-04-30 ENCOUNTER — Ambulatory Visit
Admission: RE | Admit: 2014-04-30 | Discharge: 2014-04-30 | Disposition: A | Discharge status: Home / Self Care | Payer: 59 | Source: Ambulatory Visit | Attending: Neurological Surgery | Admitting: Neurological Surgery

## 2014-04-30 DIAGNOSIS — M47812 Spondylosis without myelopathy or radiculopathy, cervical region: Secondary | ICD-10-CM

## 2016-03-04 ENCOUNTER — Emergency Department (INDEPENDENT_AMBULATORY_CARE_PROVIDER_SITE_OTHER): Payer: 59

## 2016-03-04 ENCOUNTER — Emergency Department
Admission: EM | Admit: 2016-03-04 | Discharge: 2016-03-04 | Disposition: A | Discharge status: Home / Self Care | Payer: 59 | Source: Home / Self Care | Attending: Family Medicine | Admitting: Family Medicine

## 2016-03-04 ENCOUNTER — Encounter: Payer: Self-pay | Admitting: *Deleted

## 2016-03-04 DIAGNOSIS — J40 Bronchitis, not specified as acute or chronic: Secondary | ICD-10-CM | POA: Diagnosis not present

## 2016-03-04 DIAGNOSIS — R0602 Shortness of breath: Secondary | ICD-10-CM

## 2016-03-04 LAB — POCT CBC W AUTO DIFF (K'VILLE URGENT CARE)

## 2016-03-04 MED ORDER — DOXYCYCLINE HYCLATE 100 MG PO CAPS: 100.0000 mg | ORAL_CAPSULE | Freq: Two times a day (BID) | ORAL | 0 refills | Status: DC

## 2016-03-04 MED ORDER — GUAIFENESIN-CODEINE 100-10 MG/5ML PO SOLN: ORAL | 0 refills | Status: DC

## 2016-03-04 NOTE — ED Provider Notes (Signed)
[name]    CSN: [id number] Arrival date & time: 03/04/16  1307     History   Chief Complaint Chief Complaint  Patient presents with  . Cough  . Shortness of Breath    HPI Paul Fowler is a 65 y.o. male.   Patient complains of onset of anterior chest tightness and cough 5 days ago, associated with sinus congestion, chills, and fever to 100.3 four days ago.  He continues to have chills, and fever last night was 100.1.  No sore throat.  He has a history of Crohn's disease, having been on Humira for about a year.  He also has a history of seasonal rhinitis.   The history is provided by the patient.    Past Medical History:  Diagnosis Date  . Arthritis   . Crohn's disease (HCC)   . GERD (gastroesophageal reflux disease)    occasionally has indigestion  . Multiple sclerosis (HCC)    DX 3-4 YRS AGO-- ASYMPTOMATIC  . Neuromuscular disorder (HCC)    MS  ON NO MEDS   . Ulcerative colitis (HCC)    LAST FLAREUP SPRING 2014    Patient Active Problem List   Diagnosis Date Noted  . S/P lumbar spinal fusion 07/11/2013    Past Surgical History:  Procedure Laterality Date  . BACK SURGERY    . CERVICAL SPINE SURGERY     FUSION  . ELBOW SURGERY     LEFT  . MAXIMUM ACCESS (MAS)POSTERIOR LUMBAR INTERBODY FUSION (PLIF) 1 LEVEL N/A 07/11/2013   Procedure: FOR MAXIMUM ACCESS (MAS) POSTERIOR LUMBAR INTERBODY FUSION (PLIF) 1 LEVEL LUMBAR FOUR-FIVE;  Surgeon: Tia Alert, MD;  Location: [facility];  Service: Neurosurgery;  Laterality: N/A;  . THUMB ARTHROSCOPY     LEFT  . TONSILLECTOMY    . TORN ROTATOR     LEFT SHOULDER       Home Medications    Prior to Admission medications   Medication Sig Start Date End Date Taking? Authorizing Provider  Adalimumab (HUMIRA [place]) Inject into the skin.   Yes Historical Provider, MD  buPROPion (WELLBUTRIN XL) 300 MG 24 hr tablet Take 300 mg by mouth daily.   Yes Historical Provider, MD  escitalopram (LEXAPRO) 10 MG  tablet Take 10 mg by mouth daily.   Yes Historical Provider, MD  traZODone (DESYREL) 100 MG tablet Take 100 mg by mouth at bedtime.   Yes Historical Provider, MD  B Complex-Folic Acid (B COMPLEX-VITAMIN B12 PO) Take 1 mL by mouth daily.    Historical Provider, MD  carisoprodol (SOMA) 350 MG tablet Take 1 tablet (350 mg total) by mouth 3 (three) times daily. 07/12/13   Tia Alert, MD  doxycycline (VIBRAMYCIN) 100 MG capsule Take 1 capsule (100 mg total) by mouth 2 (two) times daily. Take with food. 03/04/16   Lattie Haw, MD  guaiFENesin-codeine 100-10 MG/5ML syrup Take 10mL by mouth at bedtime as needed for cough 03/04/16   Lattie Haw, MD  mesalamine (LIALDA) 1.2 G EC tablet Take 2.4 g by mouth daily with breakfast.    Historical Provider, MD  Multiple Vitamins-Minerals (MULTIVITAMIN WITH MINERALS) tablet Take 1 tablet by mouth daily.    Historical Provider, MD  oxyCODONE-acetaminophen (PERCOCET/ROXICET) 5-325 MG per tablet Take 1-2 tablets by mouth every 6 (six) hours as needed for moderate pain. 07/12/13   Tia Alert, MD  ranitidine (ZANTAC) 150 MG tablet Take 150 mg by mouth daily as needed for heartburn.  Historical Provider, MD  senna-docusate (SENOKOT-S) 8.6-50 MG per tablet Take 2 tablets by mouth 2 (two) times daily as needed for mild constipation.    Historical Provider, MD    Family History History reviewed. No pertinent family history.  Social History Social History  Substance Use Topics  . Smoking status: Former Games developer  . Smokeless tobacco: Never Used  . Alcohol use No     Allergies   Review of patient's allergies indicates no known allergies.   Review of Systems Review of Systems  No sore throat + cough No pleuritic pain No wheezing + nasal congestion + post-nasal drainage No sinus pain/pressure No itchy/red eyes No earache No hemoptysis + SOB with activity + fever, + chills No nausea No vomiting No abdominal pain No diarrhea No urinary  symptoms No skin rash + fatigue No myalgias No headache Used OTC meds without relief    Physical Exam Triage Vital Signs ED Triage Vitals  Enc Vitals Group     BP 03/04/16 1341 99/64     Pulse Rate 03/04/16 1341 77     Resp 03/04/16 1341 16     Temp 03/04/16 1341 98.9 F (37.2 C)     Temp Source 03/04/16 1341 Oral     SpO2 03/04/16 1341 99 %     Weight 03/04/16 1341 163 lb (73.9 kg)     Height 03/04/16 1341 5\' 10"  (1.778 m)     Head Circumference --      Peak Flow --      Pain Score 03/04/16 1343 0     Pain Loc --      Pain Edu? --      Excl. in GC? --    No data found.   Updated Vital Signs BP 99/64 (BP Location: Left Arm)   Pulse 77   Temp 98.9 F (37.2 C) (Oral)   Resp 16   Ht 5\' 10"  (1.778 m)   Wt 163 lb (73.9 kg)   SpO2 99%   BMI 23.39 kg/m   Visual Acuity Right Eye Distance:   Left Eye Distance:   Bilateral Distance:    Right Eye Near:   Left Eye Near:    Bilateral Near:     Physical Exam Nursing notes and Vital Signs reviewed. Appearance:  Patient appears stated age, and in no acute distress Eyes:  Pupils are equal, round, and reactive to light and accomodation.  Extraocular movement is intact.  Conjunctivae are not inflamed  Ears:  Canals normal.  Tympanic membranes normal.  Nose:  Mildly congested turbinates.  No sinus tenderness.   Pharynx:  Normal Neck:  Supple.  No adenopathy Lungs:  Clear to auscultation.  Breath sounds are equal.  Moving air well. Heart:  Regular rate and rhythm without murmurs, rubs, or gallops.  Abdomen:  Nontender without masses or hepatosplenomegaly.  Bowel sounds are present.  No CVA or flank tenderness.  Extremities:  No edema.  Skin:  No rash present.    UC Treatments / Results  Labs (all labs ordered are listed, but only abnormal results are displayed) Labs Reviewed  POCT CBC W AUTO DIFF ([facility]):  WBC 11.0; LY 23.9; MO 6.9; GR 69.2; Hgb 13.7; Platelets 424    EKG  EKG Interpretation None         Radiology Dg Chest 2 View  Result Date: 03/04/2016 CLINICAL DATA:  Shortness of breath. EXAM: CHEST  2 VIEW COMPARISON:  07/08/2013. FINDINGS: Mediastinum and hilar structures normal. Lungs  are clear. Heart size normal. No pleural effusion or pneumothorax. Degenerative changes thoracic spine. Cervical spine fusion. IMPRESSION: No acute cardiopulmonary disease. Electronically Signed   By: Maisie Fushomas  Register   On: 03/04/2016 14:49    Procedures Procedures (including critical care time)  Medications Ordered in UC Medications - No data to display   Initial Impression / Assessment and Plan / UC Course  I have reviewed the triage vital signs and the nursing notes.  Pertinent labs & imaging results that were available during my care of the patient were reviewed by me and considered in my medical decision making (see chart for details).  Clinical Course  Note mild leukocytosis 11.0; ?atypical agent. Begin doxycycline 100mg  BID for atypical coverage. Rx for Robitussin AC for night time cough.  Take plain guaifenesin (1200mg  extended release tabs such as Mucinex) twice daily, with plenty of water, for cough and congestion.  Get adequate rest.    Stop all antihistamines for now, and other non-prescription cough/cold preparations. May take Ibuprofen 200mg , 4 tabs every 8 hours with food for chest/sternum discomfort, fever, body aches, etc   Follow-up with family doctor if not improving about10 days.      Final Clinical Impressions(s) / UC Diagnoses   Final diagnoses:  Bronchitis    New Prescriptions New Prescriptions   DOXYCYCLINE (VIBRAMYCIN) 100 MG CAPSULE    Take 1 capsule (100 mg total) by mouth 2 (two) times daily. Take with food.   GUAIFENESIN-CODEINE 100-10 MG/5ML SYRUP    Take 10mL by mouth at bedtime as needed for cough     Lattie HawStephen A Beese, MD 03/10/16 2139

## 2016-03-04 NOTE — ED Triage Notes (Signed)
Pt c/o nonproductive cough, center of chest hurts with deep breath, SOB at times, and chills x 4 days. Reports temp 100.1-100.3.

## 2016-03-04 NOTE — Discharge Instructions (Signed)
Take plain guaifenesin (1200mg  extended release tabs such as Mucinex) twice daily, with plenty of water, for cough and congestion.  Get adequate rest.    Stop all antihistamines for now, and other non-prescription cough/cold preparations. May take Ibuprofen 200mg , 4 tabs every 8 hours with food for chest/sternum discomfort, fever, body aches, etc   Follow-up with family doctor if not improving about10 days.

## 2016-11-29 DIAGNOSIS — F419 Anxiety disorder, unspecified: Secondary | ICD-10-CM | POA: Insufficient documentation

## 2016-11-29 HISTORY — DX: Anxiety disorder, unspecified: F41.9

## 2016-12-08 DIAGNOSIS — M4802 Spinal stenosis, cervical region: Secondary | ICD-10-CM | POA: Insufficient documentation

## 2016-12-08 DIAGNOSIS — E782 Mixed hyperlipidemia: Secondary | ICD-10-CM | POA: Insufficient documentation

## 2017-05-02 ENCOUNTER — Ambulatory Visit (INDEPENDENT_AMBULATORY_CARE_PROVIDER_SITE_OTHER): Payer: 59 | Admitting: Rehabilitative and Restorative Service Providers"

## 2017-05-02 DIAGNOSIS — M545 Low back pain, unspecified: Secondary | ICD-10-CM

## 2017-05-02 DIAGNOSIS — M25551 Pain in right hip: Secondary | ICD-10-CM

## 2017-05-02 DIAGNOSIS — G8929 Other chronic pain: Secondary | ICD-10-CM | POA: Diagnosis not present

## 2017-05-02 DIAGNOSIS — R29898 Other symptoms and signs involving the musculoskeletal system: Secondary | ICD-10-CM | POA: Diagnosis not present

## 2017-05-02 DIAGNOSIS — M25552 Pain in left hip: Secondary | ICD-10-CM

## 2017-05-02 NOTE — Therapy (Signed)
[facility] [place] [address] [place], [place], [address] Phone: [phone]   Fax:  [phone]  Physical Therapy Evaluation  Patient Details  Name: Paul Fowler MRN: [id number] Date of Birth: 1950/12/08 Referring Provider: Dr Malon Kindle   Encounter Date: 05/02/2017  PT End of Session - 05/02/17 1104    Visit Number  1    Number of Visits  12    Date for PT Re-Evaluation  06/13/17    PT Start Time  1102    PT Stop Time  1200    PT Time Calculation (min)  58 min    Activity Tolerance  Patient tolerated treatment well       Past Medical History:  Diagnosis Date  . Arthritis   . Crohn's disease (HCC)   . GERD (gastroesophageal reflux disease)    occasionally has indigestion  . Multiple sclerosis (HCC)    DX 3-4 YRS AGO-- ASYMPTOMATIC  . Neuromuscular disorder (HCC)    MS  ON NO MEDS   . Ulcerative colitis (HCC)    LAST FLAREUP SPRING 2014    Past Surgical History:  Procedure Laterality Date  . BACK SURGERY    . CERVICAL SPINE SURGERY     FUSION  . ELBOW SURGERY     LEFT  . MAXIMUM ACCESS (MAS)POSTERIOR LUMBAR INTERBODY FUSION (PLIF) 1 LEVEL N/A 07/11/2013   Procedure: FOR MAXIMUM ACCESS (MAS) POSTERIOR LUMBAR INTERBODY FUSION (PLIF) 1 LEVEL LUMBAR FOUR-FIVE;  Surgeon: Tia Alert, MD;  Location: [facility];  Service: Neurosurgery;  Laterality: N/A;  . THUMB ARTHROSCOPY     LEFT  . TONSILLECTOMY    . TORN ROTATOR     LEFT SHOULDER    There were no vitals filed for this visit.   Subjective Assessment - 05/02/17 1110    Subjective  Patient reports that he has been having a lot of pain in his hips and some pain in the LB. He was taking medication which helped symptoms. He has noticed incresaed pain since stopping the medication. He has pain in the LB and both hips.     Pertinent History  cervical fusion ~ 2014; lumbar fusion ~ 2013     How long can you sit comfortably?  15-20 min     How long can  you stand comfortably?  15 min     How long can you walk comfortably?  30 min     Diagnostic tests  xray     Patient Stated Goals  relief form the LBP and hip pain     Currently in Pain?  Yes    Pain Score  5     Pain Location  Back    Pain Orientation  Right;Left;Lower    Pain Descriptors / Indicators  Dull;Stabbing    Pain Type  Chronic pain    Pain Radiating Towards  radiating into both hips     Pain Onset  More than a month ago    Pain Frequency  Intermittent    Aggravating Factors   prolonged sitting; changing positions     Pain Relieving Factors  sitting on couch or recliner          [facility] PT Assessment - 05/02/17 0001      Assessment   Medical Diagnosis  LBP; bilat hip pain     Referring Provider  Dr Malon Kindle    Onset Date/Surgical Date  07/14/16    Hand Dominance  Right  Next MD Visit  2/19 as needed     Prior Therapy  yes       Precautions   Precautions  None      Balance Screen   Has the patient fallen in the past 6 months  No    Has the patient had a decrease in activity level because of a fear of falling?   No    Is the patient reluctant to leave their home because of a fear of falling?   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     Leisure  yard work seasonally; light household chores; some cooking       Observation/Other Assessments   Focus on Therapeutic Outcomes (FOTO)   46% limitation       Sensation   Additional Comments  WFL's per pt report       Posture/Postural Control   Posture Comments  head forward; shoulders rounded and eelvated; decresaed lumbar lordosis; sits with wt shifted to the Lt       AROM   AROM Assessment Site  -- some discomfort at end ranges throughout lumbar spine     Lumbar Flexion  70%    Lumbar Extension  55%    Lumbar - Right Side Bend  75%    Lumbar - Left Side Bend   75%    Lumbar - Right Rotation  50%    Lumbar - Left Rotation  50%      Strength   Overall Strength Comments  5/5 bilat LE's       Flexibility   Hamstrings  70-75%    Quadriceps  tight end ranges bilat     ITB  tight end ranges bilat     Piriformis  tight end ranges bilat       Palpation   Spinal mobility  hypomobile lumbar spine (avoiding area of fusion)     Palpation comment  muscular tightness noted through psoas; lumbar paraspinals; piriformis; glut med/max bilat              Objective measurements completed on examination: See above findings.      [facility] Adult PT Treatment/Exercise - 05/02/17 0001      Self-Care   Self-Care  -- initiated back care education       Lumbar Exercises: Stretches   Passive Hamstring Stretch  2 reps;30 seconds supine with strap     Quad Stretch  2 reps;30 seconds prone with strap     ITB Stretch  2 reps;30 seconds supine with strap     Piriformis Stretch  2 reps;30 seconds supine travell       Moist Heat Therapy   Number Minutes Moist Heat  20 Minutes    Moist Heat Location  Hip;Lumbar Spine bilat hips       Electrical Stimulation   Electrical Stimulation Location  bilat hip/piriformis area; bilat lumbar spine     Electrical Stimulation Action  IFC    Electrical Stimulation Parameters  to tolerance    Electrical Stimulation Goals  Pain;Tone             PT Education - 05/02/17 1140    Education provided  Yes    Education Details  HEP     Person(s) Educated  Patient    Methods  Explanation;Demonstration;Tactile cues;Verbal cues;Handout    Comprehension  Verbalized understanding;Returned demonstration;Verbal cues required;Tactile cues required          PT Long Term Goals - 05/02/17 1209      PT LONG TERM GOAL #1   Title  Decrease pain bilat hips by 50-75% allowing patient to sit for prolonged periods of time and move  from sit to stand with minimal to no pain 06/13/16    Time  6    Period  Weeks    Status  New       PT LONG TERM GOAL #2   Title  Improve trunk mobility and ROM allowing patient to bend to reach items on the floor with greater ease and no pain 06/13/16    Time  6    Period  Weeks    Status  New      PT LONG TERM GOAL #3   Title  Independent in HEP 06/13/16    Time  6    Period  Weeks    Status  New      PT LONG TERM GOAL #4   Title  Improve FOTO to </= 42% limitation 06/13/16    Time  6    Period  Weeks    Status  New             Plan - 05/02/17 1205    Clinical Impression Statement  Patient presents with chronic LBP and bilat hip pain present over the past 9-10 months. He has poor posture and alignment; sedentary lifestyle with job that requires him to be in sitting 40 hr/wk; limited trunk and LE mobility/ROM; muscular tightness to palpation through the lumbar spine and bilat hips. Paul Fowler will benefit form PT to address problems identified.     History and Personal Factors relevant to plan of care:  sits at computer 40 hr/wk for past 11 years     Clinical Decision Making  Low    Rehab Potential  Good    Clinical Impairments Affecting Rehab Potential  chronic nature of problems     PT Frequency  2x / week    PT Duration  6 weeks    PT Treatment/Interventions  Patient/family education;ADLs/Self Care Home Management;Cryotherapy;Electrical Stimulation;Iontophoresis 4mg /ml Dexamethasone;Moist Heat;Ultrasound;Dry needling;Manual techniques;Neuromuscular re-education;Therapeutic activities;Therapeutic exercise    PT Next Visit Plan  review HEP; add psoas stretch and core stabilization; trial of DN bilat hips; manual work through [employer] and hips; modalities as indicated     Consulted and Agree with Plan of Care  Patient       Patient will benefit from skilled therapeutic intervention in order to improve the following deficits and impairments:  Postural dysfunction, Improper body mechanics, Pain, Increased fascial restricitons, Increased muscle spasms, Decreased range of motion, Decreased  activity tolerance  Visit Diagnosis: Chronic bilateral low back pain without sciatica - Plan: PT plan of care cert/re-cert  Pain in left hip - Plan: PT plan of care cert/re-cert  Pain in right hip - Plan: PT plan of care cert/re-cert  Other symptoms and signs involving the musculoskeletal system - Plan: PT plan of care cert/re-cert     Problem List Patient Active Problem List   Diagnosis Date Noted  . S/P lumbar spinal fusion 07/11/2013    Azlyn Wingler Rober MinionP Atilano Covelli PT, MPH 05/02/2017, 12:28 PM  [facility] [address] [place], [place], [address] Phone: [phone]   Fax:  [phone]  Name: Paul Fowler MRN: [id number] Date of  Birth: 07/13/1950

## 2017-05-02 NOTE — Patient Instructions (Signed)
HIP: Hamstrings - Supine  Place strap around foot. Raise leg up, keeping knee straight.  Bend opposite knee to protect back if indicated. Hold 30 seconds. 3 reps per set, 2-3 sets per day  Outer Hip Stretch: Reclined IT Band Stretch (Strap)   Strap around one foot, pull leg across body until you feel a pull or stretch in the outside of your hip, with shoulders on mat. Hold for 30 seconds. Repeat 3 times each leg. 2-3 times/day.  Piriformis Stretch   Lying on back, pull right knee toward opposite shoulder. Hold 30 seconds. Repeat 3 times. Do 2-3 sessions per day.   Quads / HF, Prone KNEE: Quadriceps - Prone    Place strap around ankle. Bring ankle toward buttocks. Press hip into surface. Hold 30 seconds. Repeat 3 times per session. Do 2-3 sessions per day.   Trigger Point Dry Needling  . What is Trigger Point Dry Needling (DN)? o DN is a physical therapy technique used to treat muscle pain and dysfunction. Specifically, DN helps deactivate muscle trigger points (muscle knots).  o A thin filiform needle is used to penetrate the skin and stimulate the underlying trigger point. The goal is for a local twitch response (LTR) to occur and for the trigger point to relax. No medication of any kind is injected during the procedure.   . What Does Trigger Point Dry Needling Feel Like?  o The procedure feels different for each individual patient. Some patients report that they do not actually feel the needle enter the skin and overall the process is not painful. Very mild bleeding may occur. However, many patients feel a deep cramping in the muscle in which the needle was inserted. This is the local twitch response.   Marland Kitchen How Will I feel after the treatment? o Soreness is normal, and the onset of soreness may not occur for a few hours. Typically this soreness does not last longer than two days.  o Bruising is uncommon, however; ice can be used to decrease any possible bruising.  o In rare  cases feeling tired or nauseous after the treatment is normal. In addition, your symptoms may get worse before they get better, this period will typically not last longer than 24 hours.   . What Can I do After My Treatment? o Increase your hydration by drinking more water for the next 24 hours. o You may place ice or heat on the areas treated that have become sore, however, do not use heat on inflamed or bruised areas. Heat often brings more relief post needling. o You can continue your regular activities, but vigorous activity is not recommended initially after the treatment for 24 hours. o DN is best combined with other physical therapy such as strengthening, stretching, and other therapies.    TENS UNIT: This is helpful for muscle pain and spasm.   Search and Purchase a TENS 7000 2nd edition at www.tenspros.com. It should be less than $30.     TENS unit instructions: Do not shower or bathe with the unit on Turn the unit off before removing electrodes or batteries If the electrodes lose stickiness add a drop of water to the electrodes after they are disconnected from the unit and place on plastic sheet. If you continued to have difficulty, call the TENS unit company to purchase more electrodes. Do not apply lotion on the skin area prior to use. Make sure the skin is clean and dry as this will help prolong the life of the  electrodes. After use, always check skin for unusual red areas, rash or other skin difficulties. If there are any skin problems, does not apply electrodes to the same area. Never remove the electrodes from the unit by pulling the wires. Do not use the TENS unit or electrodes other than as directed. Do not change electrode placement without consultating your therapist or physician. Keep 2 fingers with between each electrode.

## 2017-05-10 ENCOUNTER — Ambulatory Visit (INDEPENDENT_AMBULATORY_CARE_PROVIDER_SITE_OTHER): Payer: 59 | Admitting: Rehabilitative and Restorative Service Providers"

## 2017-05-10 ENCOUNTER — Encounter: Payer: Self-pay | Admitting: Rehabilitative and Restorative Service Providers"

## 2017-05-10 DIAGNOSIS — M545 Low back pain, unspecified: Secondary | ICD-10-CM

## 2017-05-10 DIAGNOSIS — M25552 Pain in left hip: Secondary | ICD-10-CM

## 2017-05-10 DIAGNOSIS — G8929 Other chronic pain: Secondary | ICD-10-CM

## 2017-05-10 DIAGNOSIS — R29898 Other symptoms and signs involving the musculoskeletal system: Secondary | ICD-10-CM | POA: Diagnosis not present

## 2017-05-10 DIAGNOSIS — M25551 Pain in right hip: Secondary | ICD-10-CM

## 2017-05-10 NOTE — Patient Instructions (Signed)
Trigger Point Dry Needling  . What is Trigger Point Dry Needling (DN)? o DN is a physical therapy technique used to treat muscle pain and dysfunction. Specifically, DN helps deactivate muscle trigger points (muscle knots).  o A thin filiform needle is used to penetrate the skin and stimulate the underlying trigger point. The goal is for a local twitch response (LTR) to occur and for the trigger point to relax. No medication of any kind is injected during the procedure.   . What Does Trigger Point Dry Needling Feel Like?  o The procedure feels different for each individual patient. Some patients report that they do not actually feel the needle enter the skin and overall the process is not painful. Very mild bleeding may occur. However, many patients feel a deep cramping in the muscle in which the needle was inserted. This is the local twitch response.   Marland Kitchen How Will I feel after the treatment? o Soreness is normal, and the onset of soreness may not occur for a few hours. Typically this soreness does not last longer than two days.  o Bruising is uncommon, however; ice can be used to decrease any possible bruising.  o In rare cases feeling tired or nauseous after the treatment is normal. In addition, your symptoms may get worse before they get better, this period will typically not last longer than 24 hours.   . What Can I do After My Treatment? o Increase your hydration by drinking more water for the next 24 hours. o You may place ice or heat on the areas treated that have become sore, however, do not use heat on inflamed or bruised areas. Heat often brings more relief post needling. o You can continue your regular activities, but vigorous activity is not recommended initially after the treatment for 24 hours. o DN is best combined with other physical therapy such as strengthening, stretching, and other therapies.      Abdominal Bracing With Pelvic Floor (Hook-Lying)    With neutral spine,  tighten pelvic floor and abdominals sucking belly button to back bone; tighten muscles in low back at waist. Hold 10 sec  Repeat _10__ times. Do _several__ times a day. Progress to do this in sitting; standing; walking and with functional activities

## 2017-05-10 NOTE — Therapy (Signed)
[facility] [place] [address] [place], [place], [address] Phone: [phone]   Fax:  [phone]  Physical Therapy Treatment  Patient Details  Name: Paul Fowler MRN: [id number] Date of Birth: Sep 13, 1950 Referring Provider: Dr Malon Kindle   Encounter Date: 05/10/2017  PT End of Session - 05/10/17 1522    Visit Number  2    Number of Visits  12    Date for PT Re-Evaluation  06/13/17    PT Start Time  1518    PT Stop Time  1615    PT Time Calculation (min)  57 min    Activity Tolerance  Patient tolerated treatment well       Past Medical History:  Diagnosis Date  . Arthritis   . Crohn's disease (HCC)   . GERD (gastroesophageal reflux disease)    occasionally has indigestion  . Multiple sclerosis (HCC)    DX 3-4 YRS AGO-- ASYMPTOMATIC  . Neuromuscular disorder (HCC)    MS  ON NO MEDS   . Ulcerative colitis (HCC)    LAST FLAREUP SPRING 2014    Past Surgical History:  Procedure Laterality Date  . BACK SURGERY    . CERVICAL SPINE SURGERY     FUSION  . ELBOW SURGERY     LEFT  . MAXIMUM ACCESS (MAS)POSTERIOR LUMBAR INTERBODY FUSION (PLIF) 1 LEVEL N/A 07/11/2013   Procedure: FOR MAXIMUM ACCESS (MAS) POSTERIOR LUMBAR INTERBODY FUSION (PLIF) 1 LEVEL LUMBAR FOUR-FIVE;  Surgeon: Tia Alert, MD;  Location: [facility];  Service: Neurosurgery;  Laterality: N/A;  . THUMB ARTHROSCOPY     LEFT  . TONSILLECTOMY    . TORN ROTATOR     LEFT SHOULDER    There were no vitals filed for this visit.  Subjective Assessment - 05/10/17 1524    Subjective  patient reports that he has been working on his exercises at home. May feel a "little bit better" Still tight and painful through the LB and hips.     Currently in Pain?  Yes    Pain Score  5     Pain Location  Back    Pain Orientation  Right;Left;Lower    Pain Descriptors / Indicators  Dull;Stabbing    Pain Type  Chronic pain    Pain Radiating Towards  both hips     Pain Onset  More than a month ago                      [facility] Adult PT Treatment/Exercise - 05/10/17 0001      Lumbar Exercises: Stretches   Passive Hamstring Stretch  2 reps;30 seconds supine with strap     Hip Flexor Stretch  3 reps;30 seconds seated     Quad Stretch  2 reps;30 seconds prone with strap     ITB Stretch  2 reps;30 seconds supine with strap     Piriformis Stretch  2 reps;30 seconds supine travell modifided to use strap for stretch      Lumbar Exercises: Supine   Other Supine Lumbar Exercises  3 part core 10 [place]; 10 reps erbal and tactile cues required       Moist Heat Therapy   Number Minutes Moist Heat  20 Minutes    Moist Heat Location  Hip;Lumbar Spine bilat hips       Electrical Stimulation   Electrical Stimulation Location  bilat hip/piriformis area; bilat lumbar spine     Electrical Stimulation Action  IFC    Electrical Stimulation Parameters  to tolerance    Electrical Stimulation Goals  Pain;Tone      Manual Therapy   Manual therapy comments  pt prone     Joint Mobilization  PA glides bilat hips     Soft tissue mobilization  deep tissue work through the Rt/Lt hips - piriformis, gluts     Myofascial Release  Rt posterior hip/buttock       Trigger Point Dry Needling - 05/10/17 1659    Consent Given?  Yes    Education Handout Provided  Yes    Muscles Treated Lower Body  -- Rt     Gluteus Maximus Response  Palpable increased muscle length;Twitch response elicited    Gluteus Minimus Response  Palpable increased muscle length;Twitch response elicited    Piriformis Response  Palpable increased muscle length;Twitch response elicited           PT Education - 05/10/17 1531    Education provided  Yes    Education Details  HEP DN    Person(s) Educated  Patient    Methods  Explanation;Demonstration;Tactile cues;Verbal cues;Handout    Comprehension  Verbalized understanding;Returned demonstration;Verbal cues required;Tactile cues required           PT Long Term Goals - 05/10/17 1523      PT LONG TERM GOAL #1   Title  Decrease pain bilat hips by 50-75% allowing patient to sit for prolonged periods of time and move  from sit to stand with minimal to no pain 06/13/16    Time  6    Period  Weeks    Status  On-going      PT LONG TERM GOAL #2   Title  Improve trunk mobility and ROM allowing patient to bend to reach items on the floor with greater ease and no pain 06/13/16    Time  6    Period  Weeks    Status  On-going      PT LONG TERM GOAL #3   Title  Independent in HEP 06/13/16    Time  6    Period  Weeks    Status  On-going      PT LONG TERM GOAL #4   Title  Improve FOTO to </= 42% limitation 06/13/16    Time  6    Period  Weeks    Status  On-going            Plan - 05/10/17 1531    Clinical Impression Statement  Chronic LBP and bilat hip pain for 9=10 months. Added LE stretching without difficutly. Tolerated DN well with good release of tissue tightness noted with treatment.     Rehab Potential  Good    Clinical Impairments Affecting Rehab Potential  chronic nature of problems     PT Frequency  2x / week    PT Duration  6 weeks    PT Treatment/Interventions  Patient/family education;ADLs/Self Care Home Management;Cryotherapy;Electrical Stimulation;Iontophoresis 4mg /ml Dexamethasone;Moist Heat;Ultrasound;Dry needling;Manual techniques;Neuromuscular re-education;Therapeutic activities;Therapeutic exercise    PT Next Visit Plan  review HEP including psoas stretch and core stabilization; assess response to DN bilat hips; continue manual work through LB and hips; modalities as indicated     Consulted and Agree with Plan of Care  Patient       Patient will benefit from skilled therapeutic intervention in order to improve the following deficits and impairments:  Postural dysfunction, Improper body mechanics, Pain, Increased fascial restricitons, Increased muscle spasms, Decreased  range of motion, Decreased activity  tolerance  Visit Diagnosis: Chronic bilateral low back pain without sciatica  Pain in left hip  Pain in right hip  Other symptoms and signs involving the musculoskeletal system     Problem List Patient Active Problem List   Diagnosis Date Noted  . S/P lumbar spinal fusion 07/11/2013    Ihan Pat Rober MinionP Lauraann Missey PT, MPH  05/10/2017, 5:02 PM  [facility] [address] [place], [place], [address] Phone: [phone]   Fax:  [phone]  Name: Paul Fowler MRN: [id number] Date of Birth: [birth year]

## 2017-05-15 ENCOUNTER — Ambulatory Visit (INDEPENDENT_AMBULATORY_CARE_PROVIDER_SITE_OTHER): Payer: 59 | Admitting: Physical Therapy

## 2017-05-15 DIAGNOSIS — M25552 Pain in left hip: Secondary | ICD-10-CM

## 2017-05-15 DIAGNOSIS — M545 Low back pain, unspecified: Secondary | ICD-10-CM

## 2017-05-15 DIAGNOSIS — G8929 Other chronic pain: Secondary | ICD-10-CM

## 2017-05-15 DIAGNOSIS — M25551 Pain in right hip: Secondary | ICD-10-CM | POA: Diagnosis not present

## 2017-05-15 NOTE — Therapy (Signed)
[place], [place], [address] Phone: [phone]   Fax:  [phone]  Physical Therapy Treatment  Patient Details  Name: Paul Fowler MRN: [id number] Date of Birth: 16-Oct-1950 Referring Provider: Dr. Esmond Plants   Encounter Date: 05/15/2017  PT End of Session - 05/15/17 1516    Visit Number  3    Number of Visits  12    Date for PT Re-Evaluation  06/13/17    PT Start Time  7048    PT Stop Time  1605    PT Time Calculation (min)  49 min    Activity Tolerance  Patient tolerated treatment well    Behavior During Therapy  [facility] for tasks assessed/performed       Past Medical History:  Diagnosis Date  . Arthritis   . Crohn's disease ([place])   . GERD (gastroesophageal reflux disease)    occasionally has indigestion  . Multiple sclerosis (HCC)    DX 3-4 YRS AGO-- ASYMPTOMATIC  . Neuromuscular disorder ([place])    MS  ON NO MEDS   . Ulcerative colitis ([place])    LAST FLAREUP SPRING 2014    Past Surgical History:  Procedure Laterality Date  . BACK SURGERY    . CERVICAL SPINE SURGERY     FUSION  . ELBOW SURGERY     LEFT  . MAXIMUM ACCESS (MAS)POSTERIOR LUMBAR INTERBODY FUSION (PLIF) 1 LEVEL N/A 07/11/2013   Procedure: FOR MAXIMUM ACCESS (MAS) POSTERIOR LUMBAR INTERBODY FUSION (PLIF) 1 LEVEL LUMBAR FOUR-FIVE;  Surgeon: Eustace Moore, MD;  Location: [place] [facility];  Service: Neurosurgery;  Laterality: N/A;  . THUMB ARTHROSCOPY     LEFT  . TONSILLECTOMY    . TORN ROTATOR     LEFT SHOULDER    There were no vitals filed for this visit.  Subjective Assessment - 05/15/17 1520    Subjective  Pt reports he is feeling a little looser.  "I'm not able to do them as often as she'd like me to".       Currently in Pain?  Yes    Pain Score  3     Pain Location  Back    Pain Orientation  Right;Left;Lower         [facility] PT Assessment - 05/15/17 0001      Assessment   Medical Diagnosis  LBP; bilat hip  pain     Referring Provider  Dr. Esmond Plants    Onset Date/Surgical Date  07/14/16    Hand Dominance  Right      Palpation   SI assessment   Lt ilium elevated, Lt ASIS elevated; slight Rt sacral torsion. Lt PSIS higher than Rt.         [facility] Adult PT Treatment/Exercise - 05/15/17 0001      Lumbar Exercises: Stretches   Passive Hamstring Stretch  2 reps;30 seconds shown seated version for work    Hip Flexor Stretch  2 reps;30 seconds standing, leg extended.     Piriformis Stretch  2 reps;30 seconds seated version.       Lumbar Exercises: Supine   Other Supine Lumbar Exercises  core engaged with sit to stand x 1 rep      Moist Heat Therapy   Number Minutes Moist Heat  15 Minutes    Moist Heat Location  Hip;Lumbar Spine bilat hips       Electrical Stimulation   Electrical Stimulation Location  bilat hip/piriformis area; bilat lumbar spine  Electrical Stimulation Action  IFC    Electrical Stimulation Parameters  to tolerance     Electrical Stimulation Goals  Pain      Manual Therapy   Manual Therapy  Soft tissue mobilization;Muscle Energy Technique    Soft tissue mobilization  STM to Lt QL, glutes and piriformis.     Muscle Energy Technique  MET to correct elevated Lt ilium, performed in Lt sidelying with contract/relax of Lt QL.  MET for Rt sacral torsion with pt in prone, stretching RLE into IR.              PT Education - 05/15/17 1557    Education provided  Yes    Education Details  HEP- issued seated version of 2 stretches    Person(s) Educated  Patient    Methods  Explanation          PT Long Term Goals - 05/10/17 1523      PT LONG TERM GOAL #1   Title  Decrease pain bilat hips by 50-75% allowing patient to sit for prolonged periods of time and move  from sit to stand with minimal to no pain 06/13/16    Time  6    Period  Weeks    Status  On-going      PT LONG TERM GOAL #2   Title  Improve trunk mobility and ROM allowing patient to bend to reach  items on the floor with greater ease and no pain 06/13/16    Time  6    Period  Weeks    Status  On-going      PT LONG TERM GOAL #3   Title  Independent in HEP 06/13/16    Time  6    Period  Weeks    Status  On-going      PT LONG TERM GOAL #4   Title  Improve FOTO to </= 42% limitation 06/13/16    Time  6    Period  Weeks    Status  On-going            Plan - 05/15/17 1656    Clinical Impression Statement  Pt had positive response to DN last session; reporting less pain since last visit.  He tolerated all exercises well today, reporting reduction of pain afterward.  Pt's Lt ilium appeared higher in standing; improved alignment following MET correction.  Pt progressing towards goals.     Rehab Potential  Good    PT Frequency  2x / week    PT Duration  6 weeks    PT Treatment/Interventions  Patient/family education;ADLs/Self Care Home Management;Cryotherapy;Electrical Stimulation;Iontophoresis 67m/ml Dexamethasone;Moist Heat;Ultrasound;Dry needling;Manual techniques;Neuromuscular re-education;Therapeutic activities;Therapeutic exercise    PT Next Visit Plan  Assess pelvic alignment; manual therapy/DN as indicated.     Consulted and Agree with Plan of Care  Patient       Patient will benefit from skilled therapeutic intervention in order to improve the following deficits and impairments:  Postural dysfunction, Improper body mechanics, Pain, Increased fascial restricitons, Increased muscle spasms, Decreased range of motion, Decreased activity tolerance  Visit Diagnosis: Chronic bilateral low back pain without sciatica  Pain in left hip  Pain in right hip     Problem List Patient Active Problem List   Diagnosis Date Noted  . S/P lumbar spinal fusion 07/11/2013   JKerin Perna PTA 05/15/17 5:04 PM  [place] [address]Phone: [phone]  Fax:  [phone]  Name:  ASON HESLIN MRN: [id number] Date of Birth: 11-01-1950

## 2017-05-15 NOTE — Patient Instructions (Signed)
Hip Stretch    Put right ankle over left knee. Let right knee fall downward, but keep ankle in place. Lean forward.  Feel the stretch in hip. May push down gently with hand to feel stretch. Hold __30__ seconds while counting out loud. Repeat with other leg. Repeat __2__ times. Do _1___ sessions per day.  HIP: Hamstrings - Short Sitting    Scoot to edge of chair.  Straighten leg out in front of you. Keep knee straight. Lift chest. Hold _30__ seconds. _2-3__ reps per set, _1__ sets per day   [facility] [address] [place], [place] [address]  [phone] (office) [phone] (fax)

## 2017-05-17 ENCOUNTER — Ambulatory Visit (INDEPENDENT_AMBULATORY_CARE_PROVIDER_SITE_OTHER): Payer: 59 | Admitting: Rehabilitative and Restorative Service Providers"

## 2017-05-17 ENCOUNTER — Encounter: Payer: Self-pay | Admitting: Rehabilitative and Restorative Service Providers"

## 2017-05-17 DIAGNOSIS — M25552 Pain in left hip: Secondary | ICD-10-CM

## 2017-05-17 DIAGNOSIS — M545 Low back pain, unspecified: Secondary | ICD-10-CM

## 2017-05-17 DIAGNOSIS — G8929 Other chronic pain: Secondary | ICD-10-CM | POA: Diagnosis not present

## 2017-05-17 DIAGNOSIS — M25551 Pain in right hip: Secondary | ICD-10-CM | POA: Diagnosis not present

## 2017-05-17 DIAGNOSIS — R29898 Other symptoms and signs involving the musculoskeletal system: Secondary | ICD-10-CM

## 2017-05-17 NOTE — Therapy (Addendum)
[facility] [place], [place], [address] Phone: [phone]   Fax:  [phone]  Physical Therapy Treatment  Patient Details  Name: Paul Fowler MRN: [id number] Date of Birth: 21-Dec-1950 Referring Provider: Dr Esmond Plants   Encounter Date: 05/17/2017  PT End of Session - 05/17/17 1521    Visit Number  4    Number of Visits  12    Date for PT Re-Evaluation  06/13/17    PT Start Time  4709    PT Stop Time  1615    PT Time Calculation (min)  59 min    Activity Tolerance  Patient tolerated treatment well       Past Medical History:  Diagnosis Date  . Arthritis   . Crohn's disease ([place])   . GERD (gastroesophageal reflux disease)    occasionally has indigestion  . Multiple sclerosis ([facility])    DX 3-4 YRS AGO-- ASYMPTOMATIC  . Neuromuscular disorder ([place])    MS  ON NO MEDS   . Ulcerative colitis ([place])    LAST FLAREUP SPRING 2014    Past Surgical History:  Procedure Laterality Date  . BACK SURGERY    . CERVICAL SPINE SURGERY     FUSION  . ELBOW SURGERY     LEFT  . MAXIMUM ACCESS (MAS)POSTERIOR LUMBAR INTERBODY FUSION (PLIF) 1 LEVEL N/A 07/11/2013   Procedure: FOR MAXIMUM ACCESS (MAS) POSTERIOR LUMBAR INTERBODY FUSION (PLIF) 1 LEVEL LUMBAR FOUR-FIVE;  Surgeon: Eustace Moore, MD;  Location: [facility];  Service: Neurosurgery;  Laterality: N/A;  . THUMB ARTHROSCOPY     LEFT  . TONSILLECTOMY    . TORN ROTATOR     LEFT SHOULDER    There were no vitals filed for this visit.  Subjective Assessment - 05/17/17 1522    Subjective  Feels he is making some progress. Wants to know about walking on level surfaces. He wants to hike but did not yesterday remembering that he needed to stay on level surfaces.     Currently in Pain?  Yes    Pain Score  3     Pain Location  Back    Pain Orientation  Right;Left;Lower    Pain Descriptors / Indicators  Stabbing;Dull    Pain Radiating Towards  Lt hip pain 5/10  today     Pain Onset  More than a month ago    Pain Frequency  Intermittent         [facility] PT Assessment - 05/17/17 0001      Assessment   Medical Diagnosis  LBP; bilat hip pain     Referring Provider  Dr Esmond Plants    Onset Date/Surgical Date  07/14/16    Hand Dominance  Right    Next MD Visit  2/19 as needed       AROM   Lumbar Flexion  70%    Lumbar Extension  55%      Palpation   SI assessment   Lt ilium slightly elevated, Lt ASIS slightly elevated; Lt PSIS slightly higher than Rt.                   [facility] Adult PT Treatment/Exercise - 05/17/17 0001      Lumbar Exercises: Stretches   Passive Hamstring Stretch  2 reps;30 seconds shown seated version for work    ITB Stretch  2 reps;30 seconds    Piriformis Stretch  2 reps;30 seconds supine with PT assist varying  angles  knee to opposite shd        Lumbar Exercises: Aerobic   Tread Mill  Laps around gym after manual therapy.       Lumbar Exercises: Prone   Opposite Arm/Leg Raise  Right arm/Left leg;Left arm/Right leg;5 reps;2 seconds      Moist Heat Therapy   Number Minutes Moist Heat  20 Minutes    Moist Heat Location  Hip;Lumbar Spine bilat hips       Electrical Stimulation   Electrical Stimulation Location  Lt hip/piriformis area; bilat lumbar spine     Electrical Stimulation Action  IFC    Electrical Stimulation Parameters  to tolerance    Electrical Stimulation Goals  Pain      Manual Therapy   Manual therapy comments  pt sidelying and prone for manual work by Yahoo Long, PTA     Soft tissue mobilization  STM to Lt QL, glutes and piriformis.     Muscle Energy Technique  MET to correct elevated Lt ilium, performed in Lt sidelying with contract/relax of Lt QL.  MET for Rt sacral torsion with pt in prone, stretching RLE into IR.                   PT Long Term Goals - 05/17/17 1521      PT LONG TERM GOAL #1   Title  Decrease pain bilat hips by 50-75% allowing patient to sit for  prolonged periods of time and move  from sit to stand with minimal to no pain 06/13/16    Time  6    Period  Weeks    Status  On-going      PT LONG TERM GOAL #2   Title  Improve trunk mobility and ROM allowing patient to bend to reach items on the floor with greater ease and no pain 06/13/16    Time  6    Period  Weeks    Status  On-going      PT LONG TERM GOAL #3   Title  Independent in HEP 06/13/16    Time  6    Period  Weeks    Status  On-going      PT LONG TERM GOAL #4   Title  Improve FOTO to </= 42% limitation 06/13/16    Time  6    Period  Weeks    Status  On-going            Plan - 05/17/17 1655    Clinical Impression Statement  Continued gradual improvement with less pain. Patient has pelvic asymmetries; muscular tightness through bilat hips. Patient responds well to treatment.      Rehab Potential  Good    PT Frequency  2x / week    PT Duration  6 weeks    PT Treatment/Interventions  Patient/family education;ADLs/Self Care Home Management;Cryotherapy;Electrical Stimulation;Iontophoresis '4mg'$ /ml Dexamethasone;Moist Heat;Ultrasound;Dry needling;Manual techniques;Neuromuscular re-education;Therapeutic activities;Therapeutic exercise    PT Next Visit Plan  continue to assess pelvic alignment; manual therapy/DN as indicated; stretching through the piriformis with PT assist      Consulted and Agree with Plan of Care  Patient       Patient will benefit from skilled therapeutic intervention in order to improve the following deficits and impairments:  Postural dysfunction, Improper body mechanics, Pain, Increased fascial restricitons, Increased muscle spasms, Decreased range of motion, Decreased activity tolerance  Visit Diagnosis: Chronic bilateral low back pain without sciatica  Pain in left hip  Pain  in right hip  Other symptoms and signs involving the musculoskeletal system     Problem List Patient Active Problem List   Diagnosis Date Noted  . S/P lumbar  spinal fusion 07/11/2013    Roselee Tayloe Nilda Simmer PT, MPH  05/17/2017, 5:00 PM  [facility] [place], [place], [address] Phone: [phone]   Fax:  [phone]  Name: Paul Fowler MRN: [id number] Date of Birth: 25-Jan-1951  PHYSICAL THERAPY DISCHARGE SUMMARY  Visits from Start of Care: 4  Current functional level related to goals / functional outcomes: See progress note for discharge status   Remaining deficits: Unknown    Education / Equipment: HEP  Plan: Patient agrees to discharge.  Patient goals were partially met. Patient is being discharged due to being pleased with the current functional level.  ?????     Adhya Cocco P. Helene Kelp PT, MPH 06/28/17 2:09 PM

## 2017-05-26 DIAGNOSIS — F1111 Opioid abuse, in remission: Secondary | ICD-10-CM | POA: Insufficient documentation

## 2018-04-09 LAB — HM COLONOSCOPY

## 2018-08-22 ENCOUNTER — Telehealth: Payer: Self-pay | Admitting: Family Medicine

## 2018-08-22 NOTE — Telephone Encounter (Signed)
Error

## 2018-09-25 ENCOUNTER — Ambulatory Visit: Payer: Self-pay | Admitting: Family Medicine

## 2018-09-28 ENCOUNTER — Encounter: Payer: Self-pay | Admitting: Family Medicine

## 2018-09-28 ENCOUNTER — Ambulatory Visit (INDEPENDENT_AMBULATORY_CARE_PROVIDER_SITE_OTHER): Payer: Medicare Other | Admitting: Family Medicine

## 2018-09-28 VITALS — BP 123/68 | HR 90 | Temp 98.7°F | Ht 70.0 in | Wt 190.0 lb

## 2018-09-28 DIAGNOSIS — N529 Male erectile dysfunction, unspecified: Secondary | ICD-10-CM | POA: Insufficient documentation

## 2018-09-28 DIAGNOSIS — K50919 Crohn's disease, unspecified, with unspecified complications: Secondary | ICD-10-CM

## 2018-09-28 DIAGNOSIS — Z720 Tobacco use: Secondary | ICD-10-CM | POA: Diagnosis not present

## 2018-09-28 DIAGNOSIS — E782 Mixed hyperlipidemia: Secondary | ICD-10-CM | POA: Diagnosis not present

## 2018-09-28 DIAGNOSIS — F1011 Alcohol abuse, in remission: Secondary | ICD-10-CM | POA: Insufficient documentation

## 2018-09-28 DIAGNOSIS — K5090 Crohn's disease, unspecified, without complications: Secondary | ICD-10-CM | POA: Insufficient documentation

## 2018-09-28 MED ORDER — VARENICLINE TARTRATE 0.5 MG X 11 & 1 MG X 42 PO MISC: ORAL | 0 refills | Status: DC

## 2018-09-28 NOTE — Progress Notes (Signed)
New Patient Office Visit  Subjective:  Patient ID: Paul Fowler, male    DOB: 05/21/1950  Age: 68 y.o. MRN: [id number]  CC:  Chief Complaint  Patient presents with  . Establish Care    HPI Paul Fowler presents to establish care.  He has a history of Crohn's disease and follows with Dr. Noe Gens at [facility].  He is followed at pain management by Dr. Delilah Shan for chronic low back and cervical pain status post surgery of the low back and neck.  He is on Dilaudid.  His surgeon was Dr. Marikay Alar.  Does have some hardware in his spine but has had chronic pain since surgery.  His disease-he is on Humira and has been on it for several years he has done well with it.  He quit smoking  About 10 years ago but recently starting vaping in the last year.  He has tried to quit but is really having a hard time quitting.  He has taken Chantix in the past and would like to try that again.  He also has a history of alcohol abuse.  He said he drank until about 1987 and then was sober for about 7 years.  He started drinking again for about a year on and off and then was able to quit for a year.  Started drinking again and then was able to be sober for 12 years which is the longest that he has been sober.  His last drink was July 2018 so he has been sober this time for almost 2 years.  He goes to 3-4 AA meetings per week and says that it is helpful.  He has a history of chronic numbness in his fingertips particularly in his right hand after a fall years ago where he was try to pull [place] by the yard and fell backwards.  Immediately after the injury within a couple days he had numbness down his entire arm and shoulder but now just has some residual numbness in his fingertips..    Past Medical History:  Diagnosis Date  . Arthritis   . Crohn's disease ([facility])   . GERD (gastroesophageal reflux disease)    occasionally has indigestion  . Multiple sclerosis ([facility])    DX 3-4 YRS AGO-- ASYMPTOMATIC  . Neuromuscular disorder ([facility])    MS  ON NO MEDS   . Ulcerative colitis ([facility])    LAST FLAREUP SPRING 2014    Past Surgical History:  Procedure Laterality Date  . BACK SURGERY    . CERVICAL SPINE SURGERY     FUSION  . ELBOW SURGERY     LEFT  . MAXIMUM ACCESS (MAS)POSTERIOR LUMBAR INTERBODY FUSION (PLIF) 1 LEVEL N/A 07/11/2013   Procedure: FOR MAXIMUM ACCESS (MAS) POSTERIOR LUMBAR INTERBODY FUSION (PLIF) 1 LEVEL LUMBAR FOUR-FIVE;  Surgeon: Tia Alert, MD;  Location: [facility];  Service: Neurosurgery;  Laterality: N/A;  . THUMB ARTHROSCOPY     LEFT  . TONSILLECTOMY    . TORN ROTATOR     LEFT SHOULDER    History reviewed. No pertinent family history.  Social History   Socioeconomic History  . Marital status: Divorced    Spouse name: Not on file  . Number of children: Not on file  . Years of education: Not on file  . Highest education level: Not on file  Occupational History  . Not on file  Social Needs  . Financial resource strain: Not on file  .  Food insecurity:    Worry: Not on file    Inability: Not on file  . Transportation needs:    Medical: Not on file    Non-medical: Not on file  Tobacco Use  . Smoking status: Former Games developer  . Smokeless tobacco: Never Used  Substance and Sexual Activity  . Alcohol use: No  . Drug use: No  . Sexual activity: Not on file  Lifestyle  . Physical activity:    Days per week: Not on file    Minutes per session: Not on file  . Stress: Not on file  Relationships  . Social connections:    Talks on phone: Not on file    Gets together: Not on file    Attends religious service: Not on file    Active member of club or organization: Not on file    Attends meetings of clubs or organizations: Not on file    Relationship status: Not on file  . Intimate partner violence:    Fear of current or ex partner: Not on file    Emotionally abused: Not on file    Physically abused: Not on file    Forced  sexual activity: Not on file  Other Topics Concern  . Not on file  Social History Narrative  . Not on file    ROS Review of Systems  Objective:   Today's Vitals: BP 123/68   Pulse 90   Temp 98.7 F (37.1 C)   Ht  (1.778 m)   Wt 190 lb (86.2 kg)   SpO2 99%   BMI 27.26 kg/m   Physical Exam  Assessment & Plan:   Problem List Items Addressed This Visit      Digestive   Crohn's disease [facility])    Follows with Dr. Noe Gens at [facility].  Currently on Humira.        Musculoskeletal and Integument   Dysbetalipoproteinemia - Primary    Neurology but I would like to check his lipids I could not find any recent ones done by his PCP.To recheck lipids.  He had recent lab work done      Relevant Orders   Lipid panel     Other   Tobacco abuse    Encourage cessation.  Discussed Chantix.  Will send over starter pack.  Reminded him about potential side effects he has taken it before and did well with it.      Relevant Medications   varenicline (CHANTIX PAK) 0.5 MG X 11 & 1 MG X 42 tablet   Alcohol abuse, in remission    He has been sober for almost 2 years.  Actively going to [employer] multiple times a week.  They have been doing at the resume right now with COVID.         Outpatient Encounter Medications as of 09/28/2018  Medication Sig  . Adalimumab 10 MG/0.2ML PSKT Inject into the skin every 7 (seven) days.  Marland Kitchen gabapentin (NEURONTIN) 800 MG tablet Take 800 mg by mouth every 8 (eight) hours.  Marland Kitchen HYDROmorphone (DILAUDID) 4 MG tablet Take 4 mg by mouth every 8 (eight) hours.  . hydrOXYzine (ATARAX/VISTARIL) 25 MG tablet Take 25 mg by mouth 2 (two) times daily as needed.  . traZODone (DESYREL) 150 MG tablet Take 1 tablet by mouth at bedtime.  . ARIPiprazole (ABILIFY) 5 MG tablet Take 5 mg by mouth daily.  Marland Kitchen atorvastatin (LIPITOR) 10 MG tablet Take 10 mg by mouth daily.  Marland Kitchen  B Complex-Folic Acid (B COMPLEX-VITAMIN B12 PO) Take 1 mL by  mouth daily.  Marland Kitchen. buPROPion (WELLBUTRIN XL) 300 MG 24 hr tablet Take 300 mg by mouth daily.  . carisoprodol (SOMA) 350 MG tablet Take 1 tablet (350 mg total) by mouth 3 (three) times daily.  Marland Kitchen. escitalopram (LEXAPRO) 10 MG tablet Take 10 mg by mouth daily.  . Multiple Vitamins-Minerals (MULTIVITAMIN WITH MINERALS) tablet Take 1 tablet by mouth daily.  . ranitidine (ZANTAC) 150 MG tablet Take 150 mg by mouth daily as needed for heartburn.  . senna-docusate (SENOKOT-S) 8.6-50 MG per tablet Take 2 tablets by mouth 2 (two) times daily as needed for mild constipation.  . varenicline (CHANTIX PAK) 0.5 MG X 11 & 1 MG X 42 tablet Take one 0.5 mg tablet by mouth once daily for 3 days, then increase to one 0.5 mg tablet twice daily for 4 days, then increase to one 1 mg tablet twice daily.  . [DISCONTINUED] Adalimumab (HUMIRA [place]) Inject into the skin.  . [DISCONTINUED] celecoxib (CELEBREX) 100 MG capsule Take 100 mg by mouth daily.  . [DISCONTINUED] doxycycline (VIBRAMYCIN) 100 MG capsule Take 1 capsule (100 mg total) by mouth 2 (two) times daily. Take with food. (Patient not taking: Reported on 05/02/2017)  . [DISCONTINUED] guaiFENesin-codeine 100-10 MG/5ML syrup Take 10mL by mouth at bedtime as needed for cough (Patient not taking: Reported on 05/02/2017)  . [DISCONTINUED] mesalamine (LIALDA) 1.2 G EC tablet Take 2.4 g by mouth daily with breakfast.  . [DISCONTINUED] oxyCODONE-acetaminophen (PERCOCET/ROXICET) 5-325 MG per tablet Take 1-2 tablets by mouth every 6 (six) hours as needed for moderate pain. (Patient not taking: Reported on 05/02/2017)  . [DISCONTINUED] traZODone (DESYREL) 100 MG tablet Take 100 mg by mouth at bedtime.   No facility-administered encounter medications on file as of 09/28/2018.     Follow-up: Return in about 4 months (around 02/10/2019) for Medicare Wellness Exam .   Nani Gasseratherine Metheney, MD

## 2018-09-28 NOTE — Assessment & Plan Note (Signed)
Follows with Dr. Noe Gens at [facility].  Currently on Humira.

## 2018-09-28 NOTE — Assessment & Plan Note (Signed)
He has been sober for almost 2 years.  Actively going to [employer] multiple times a week.  They have been doing at the resume right now with COVID.

## 2018-09-28 NOTE — Assessment & Plan Note (Signed)
Encourage cessation.  Discussed Chantix.  Will send over starter pack.  Reminded him about potential side effects he has taken it before and did well with it.

## 2018-09-28 NOTE — Assessment & Plan Note (Signed)
Neurology but I would like to check his lipids I could not find any recent ones done by his PCP.To recheck lipids.  He had recent lab work done

## 2018-11-06 LAB — LIPID PANEL
Cholesterol: 158 mg/dL (ref <200)
HDL: 42 mg/dL (ref >=40)
LDL Cholesterol (Calc): 94 mg/dL (calc)
Non-HDL Cholesterol (Calc): 116 mg/dL (calc) (ref <130)
Total CHOL/HDL Ratio: 3.8 (calc) (ref <5.0)
Triglycerides: 120 mg/dL (ref <150)

## 2018-11-06 NOTE — Progress Notes (Signed)
All labs are normal. 

## 2018-11-07 ENCOUNTER — Other Ambulatory Visit: Payer: Self-pay | Admitting: *Deleted

## 2018-11-07 MED ORDER — ATORVASTATIN CALCIUM 10 MG PO TABS: 10.0000 mg | ORAL_TABLET | Freq: Every day | ORAL | 1 refills | Status: DC

## 2018-11-07 MED ORDER — HYDROXYZINE HCL 25 MG PO TABS: 25.0000 mg | ORAL_TABLET | Freq: Two times a day (BID) | ORAL | 0 refills | Status: DC

## 2018-11-21 ENCOUNTER — Other Ambulatory Visit: Payer: Self-pay | Admitting: Family Medicine

## 2018-11-21 DIAGNOSIS — M545 Low back pain, unspecified: Secondary | ICD-10-CM

## 2018-12-06 ENCOUNTER — Other Ambulatory Visit: Payer: Self-pay | Admitting: *Deleted

## 2018-12-06 MED ORDER — TRAZODONE HCL 150 MG PO TABS: 150.0000 mg | ORAL_TABLET | Freq: Every day | ORAL | 1 refills | Status: DC

## 2018-12-14 ENCOUNTER — Other Ambulatory Visit: Payer: Self-pay | Admitting: Family Medicine

## 2018-12-14 DIAGNOSIS — F5101 Primary insomnia: Secondary | ICD-10-CM

## 2018-12-14 NOTE — Telephone Encounter (Signed)
It looks like this is a refill request for the 100 mg but it looks like he has on file 150 mg.  May need to call the patient and clarify which prescription he is actually taking.

## 2018-12-14 NOTE — Telephone Encounter (Signed)
Patient is on the 150mg  and needs a refill. Can you send that to his pharmacy please. KG LPN

## 2018-12-17 MED ORDER — TRAZODONE HCL 100 MG PO TABS: 100.0000 mg | ORAL_TABLET | Freq: Every day | ORAL | 1 refills | Status: DC

## 2018-12-17 NOTE — Addendum Note (Signed)
Addended by: Beatrice Lecher D on: 12/17/2018 08:56 AM   Modules accepted: Orders

## 2018-12-18 ENCOUNTER — Emergency Department (INDEPENDENT_AMBULATORY_CARE_PROVIDER_SITE_OTHER)
Admission: EM | Admit: 2018-12-18 | Discharge: 2018-12-18 | Disposition: A | Discharge status: Home / Self Care | Payer: Medicare Other | Source: Home / Self Care | Attending: Family Medicine | Admitting: Family Medicine

## 2018-12-18 ENCOUNTER — Other Ambulatory Visit: Payer: Self-pay

## 2018-12-18 ENCOUNTER — Emergency Department (INDEPENDENT_AMBULATORY_CARE_PROVIDER_SITE_OTHER): Payer: Medicare Other

## 2018-12-18 DIAGNOSIS — S62611A Displaced fracture of proximal phalanx of left index finger, initial encounter for closed fracture: Secondary | ICD-10-CM

## 2018-12-18 DIAGNOSIS — W228XXA Striking against or struck by other objects, initial encounter: Secondary | ICD-10-CM

## 2018-12-18 DIAGNOSIS — S62661A Nondisplaced fracture of distal phalanx of left index finger, initial encounter for closed fracture: Secondary | ICD-10-CM | POA: Diagnosis not present

## 2018-12-18 NOTE — ED Triage Notes (Signed)
Jammed left index finger 10 days ago.  Finger popped at the time.  Finger is slightly deformed.

## 2018-12-18 NOTE — ED Provider Notes (Signed)
[facility]    CSN: [id number] Arrival date & time: 12/18/18  1319     History   Chief Complaint Chief Complaint  Patient presents with  . Finger Injury    HPI Paul Fowler is a 68 y.o. male.   HPI  Paul Fowler is a 68 y.o. male presenting to UC with c/o 10 days of Left index finger pain, swelling and mild deformity. Pt states he attempted to jam/point his finger into a new package of toilet rolls, injuring his finger. Pt felt a pop at the time.  He has tried ice but no relief.  Pain is a mild ache. No prior fracture to same finger. He is Right handed.  Pt questions if his bones are getting weaker as he has used this same technique to open toilet roll packaging before w/o issue. No other recent fractures. No prior bone issues that he knows of.   Past Medical History:  Diagnosis Date  . Arthritis   . Crohn's disease (HCC)   . GERD (gastroesophageal reflux disease)    occasionally has indigestion  . Multiple sclerosis (HCC)    DX 3-4 YRS AGO-- ASYMPTOMATIC  . Neuromuscular disorder (HCC)    MS  ON NO MEDS   . Ulcerative colitis (HCC)    LAST FLAREUP SPRING 2014    Patient Active Problem List   Diagnosis Date Noted  . ED (erectile dysfunction) 09/28/2018  . Crohn's disease (HCC) 09/28/2018  . 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 Tobacco abuse 09/28/2018  . History of opioid abuse (HCC) 05/26/2017  . Foraminal stenosis of cervical region 12/08/2016  . 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 Anxiety disorder 11/29/2016  . S/P lumbar spinal fusion 07/11/2013  . History of immunosuppression therapy 09/10/2012    Past Surgical History:  Procedure Laterality Date  . BACK SURGERY    . CERVICAL SPINE SURGERY     FUSION  . ELBOW SURGERY     LEFT  . MAXIMUM ACCESS (MAS)POSTERIOR LUMBAR INTERBODY FUSION (PLIF) 1 LEVEL N/A 07/11/2013   Procedure: FOR MAXIMUM ACCESS (MAS) POSTERIOR LUMBAR INTERBODY FUSION (PLIF) 1 LEVEL LUMBAR FOUR-FIVE;  Surgeon: Tia Alertavid S  Jones, MD;  Location: [facility];  Service: Neurosurgery;  Laterality: N/A;  . THUMB ARTHROSCOPY     LEFT  . TONSILLECTOMY    . TORN ROTATOR     LEFT SHOULDER       Home Medications    Prior to Admission medications   Medication Sig Start Date End Date Taking? Authorizing Provider  Adalimumab 10 MG/0.2ML PSKT Inject into the skin every 7 (seven) days.    [provider]  ARIPiprazole (ABILIFY) 5 MG tablet Take 5 mg by mouth daily. 08/13/18   [provider]  atorvastatin (LIPITOR) 10 MG tablet Take 1 tablet (10 mg total) by mouth daily. 11/07/18   Agapito GamesMetheney, Catherine D, MD  B Complex-Folic Acid (B COMPLEX-VITAMIN B12 PO) Take 1 mL by mouth daily.    [provider]  buPROPion (WELLBUTRIN XL) 300 MG 24 hr tablet Take 300 mg by mouth daily.    [provider]  carisoprodol (SOMA) 350 MG tablet TAKE 1 TABLET BY MOUTH THREE TIMES A DAY AS NEEDED 11/21/18   Agapito GamesMetheney, Catherine D, MD  escitalopram (LEXAPRO) 10 MG tablet Take 10 mg by mouth daily.    [provider]  gabapentin (NEURONTIN) 800 MG tablet Take 800 mg by mouth every 8 (eight) hours. 05/16/16   [provider]  HYDROmorphone (  DILAUDID) 4 MG tablet Take 4 mg by mouth every 8 (eight) hours. 11/13/17   [provider]  hydrOXYzine (ATARAX/VISTARIL) 25 MG tablet Take 1 tablet (25 mg total) by mouth 2 (two) times a day. 11/07/18   Agapito GamesMetheney, Catherine D, MD  Multiple Vitamins-Minerals (MULTIVITAMIN WITH MINERALS) tablet Take 1 tablet by mouth daily.    [provider]  ranitidine (ZANTAC) 150 MG tablet Take 150 mg by mouth daily as needed for heartburn.    [provider]  senna-docusate (SENOKOT-S) 8.6-50 MG per tablet Take 2 tablets by mouth 2 (two) times daily as needed for mild constipation.    [provider]  traZODone (DESYREL) 100 MG tablet Take 1 tablet (100 mg total) by mouth at bedtime. 12/17/18   Agapito GamesMetheney, Catherine D, MD  traZODone (DESYREL) 150  MG tablet Take 1 tablet (150 mg total) by mouth at bedtime. 12/06/18   Agapito GamesMetheney, Catherine D, MD  varenicline (CHANTIX PAK) 0.5 MG X 11 & 1 MG X 42 tablet Take one 0.5 mg tablet by mouth once daily for 3 days, then increase to one 0.5 mg tablet twice daily for 4 days, then increase to one 1 mg tablet twice daily. 09/28/18   Agapito GamesMetheney, Catherine D, MD    Family History History reviewed. No pertinent family history.  Social History Social History   Tobacco Use  . Smoking status: Former Games developermoker  . Smokeless tobacco: Never Used  Substance Use Topics  . Alcohol use: No  . Drug use: No     Allergies   Buspirone, Diclofenac, Interferon beta-1a, Venlafaxine, Methocarbamol, and Saccharin   Review of Systems Review of Systems  Musculoskeletal: Positive for arthralgias and joint swelling.  Skin: Positive for color change. Negative for wound.  Neurological: Positive for weakness. Negative for numbness.     Physical Exam Triage Vital Signs ED Triage Vitals  Enc Vitals Group     BP 12/18/18 1337 130/83     Pulse Rate 12/18/18 1337 70     Resp 12/18/18 1337 20     Temp 12/18/18 1337 98.3 F (36.8 C)     Temp Source 12/18/18 1337 Oral     SpO2 12/18/18 1337 99 %     Weight 12/18/18 1338 184 lb (83.5 kg)     Height 12/18/18 1338 5\' 10"  (1.778 m)     Head Circumference --      Peak Flow --      Pain Score 12/18/18 1338 2     Pain Loc --      Pain Edu? --      Excl. in GC? --    No data found.  Updated Vital Signs BP 130/83 (BP Location: Right Arm)   Pulse 70   Temp 98.3 F (36.8 C) (Oral)   Resp 20   Ht 5\' 10"  (1.778 m)   Wt 184 lb (83.5 kg)   SpO2 99%   BMI 26.40 kg/m   Visual Acuity Right Eye Distance:   Left Eye Distance:   Bilateral Distance:    Right Eye Near:   Left Eye Near:    Bilateral Near:     Physical Exam Vitals signs and nursing note reviewed.  Constitutional:      Appearance: Normal appearance. He is well-developed.  HENT:     Head:  Normocephalic and atraumatic.  Neck:     Musculoskeletal: Normal range of motion.  Cardiovascular:     Rate and Rhythm: Normal rate.  Pulmonary:  Effort: Pulmonary effort is normal.  Musculoskeletal:        General: Swelling and tenderness present.     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 Mild edema and tenderness at DIP joint. Full flexion, unable to fully extend at DIP joint.  Skin:    General: Skin is warm and dry.     Capillary Refill: Capillary refill takes less than 2 seconds.     Findings: Bruising present.  Neurological:     Mental Status: He is alert and oriented to person, place, and time.  Psychiatric:        Behavior: Behavior normal.      UC Treatments / Results  Labs (all labs ordered are listed, but only abnormal results are displayed) Labs Reviewed - No data to display  EKG   Radiology Dg Hand Complete Left  Result Date: 12/18/2018 CLINICAL DATA:  Increasing pain at the left second IP joint, post blunt injury to the finger. EXAM: LEFT HAND - COMPLETE 3+ VIEW COMPARISON:  None. FINDINGS: There is an avulsion fracture off of the dorsal surface of the proximal second phalanx at the DIP joint without significant displacement. Associated soft tissue swelling. IMPRESSION: Avulsion fracture off of the dorsal surface of the proximal second phalanx at the DIP joint without significant displacement. Electronically Signed   By: Ted Mcalpine M.D.   On: 12/18/2018 14:13    Procedures Splint Application  Date/Time: 12/18/2018 4:44 PM Performed by: Lurene Shadow, PA-C Authorized by: Lattie Haw, MD   Consent:    Consent obtained:  Verbal   Consent given by:  Patient   Risks discussed:  Discoloration, numbness, pain and swelling   Alternatives discussed:  Delayed treatment and no treatment Pre-procedure details:    Sensation:  Normal   Skin color:  Faint ecchymois at nailbed Procedure details:    Laterality:  Left   Strapping: no      Splint type:  Finger (stack splint)   Supplies:  Prefabricated splint Post-procedure details:    Pain:  Unchanged   Sensation:  Normal   Patient tolerance of procedure:  Tolerated well, no immediate complications   (including critical care time)  Medications Ordered in UC Medications - No data to display  Initial Impression / Assessment and Plan / UC Course  I have reviewed the triage vital signs and the nursing notes.  Pertinent labs & imaging results that were available during my care of the patient were reviewed by me and considered in my medical decision making (see chart for details).     Reviewed imaging with pt.  Finger splinted as noted above Encouraged to check with PCP about bony density screening for concerns. F/u with Sports Medicine or Tomasita Crumble (who he has seen in the past) within 1 week for further evaluation and treatment of finger given deformity and location of fracture.  Pt agreeable.   Final Clinical Impressions(s) / UC Diagnoses   Final diagnoses:  Closed nondisplaced fracture of distal phalanx of left index finger, initial encounter     Discharge Instructions       You should wear the finger splint until follow up with Sports Medicine or a previously established orthopedist.  It is also recommended you discuss today's visit with your family doctor to see if she would like to perform any bone density studies.      ED Prescriptions    None     Controlled Substance Prescriptions  Controlled Substance Registry consulted? Not Applicable  Noe Gens, [place] 12/18/18 1644

## 2018-12-18 NOTE — Discharge Instructions (Addendum)
° °  You should wear the finger splint until follow up with Sports Medicine or a previously established orthopedist.  It is also recommended you discuss today's visit with your family doctor to see if she would like to perform any bone density studies.

## 2018-12-19 ENCOUNTER — Ambulatory Visit (INDEPENDENT_AMBULATORY_CARE_PROVIDER_SITE_OTHER): Payer: Medicare Other | Admitting: Family Medicine

## 2018-12-19 ENCOUNTER — Encounter: Payer: Self-pay | Admitting: Family Medicine

## 2018-12-19 VITALS — BP 123/80 | HR 72 | Temp 98.7°F | Wt 184.0 lb

## 2018-12-19 DIAGNOSIS — S62639A Displaced fracture of distal phalanx of unspecified finger, initial encounter for closed fracture: Secondary | ICD-10-CM | POA: Diagnosis not present

## 2018-12-19 DIAGNOSIS — M545 Low back pain, unspecified: Secondary | ICD-10-CM

## 2018-12-19 DIAGNOSIS — M20019 Mallet finger of unspecified finger(s): Secondary | ICD-10-CM | POA: Diagnosis not present

## 2018-12-19 MED ORDER — CARISOPRODOL 350 MG PO TABS: 350.0000 mg | ORAL_TABLET | Freq: Three times a day (TID) | ORAL | 0 refills | Status: DC | PRN

## 2018-12-19 NOTE — Progress Notes (Signed)
Paul NossKenneth T Lehr is a 68 y.o. male who [facility] for left finger injury.  Patient was trying to open a box of toilet paper by jamming his finger into it to tear it open.  This occurred about a month ago when he developed pain and swelling at his left index finger.  He was seen in urgent care yesterday after he failed to improve.  X-ray showed a mallet fracture.  He was treated with a stack splint which helps.  He denies fevers chills nausea vomiting or diarrhea.  He denies any weakness or numbness distally.    ROS:  As above  Exam:  BP 123/80   Pulse 72   Temp 98.7 F (37.1 C) (Oral)   Wt 184 lb (83.5 kg)   BMI 26.40 kg/m  Wt Readings from Last 5 Encounters:  12/19/18 184 lb (83.5 kg)  12/18/18 184 lb (83.5 kg)  09/28/18 190 lb (86.2 kg)  03/04/16 163 lb (73.9 kg)  07/08/13 170 lb 4.8 oz (77.2 kg)   General: Well Developed, well nourished, and in no acute distress.  Neuro/Psych: Alert and oriented x3, extra-ocular muscles intact, able to move all 4 extremities, sensation grossly intact. Skin: Warm and dry, no rashes noted.  Respiratory: Not using accessory muscles, speaking in full sentences, trachea midline.  Cardiovascular: Pulses palpable, no extremity edema. Abdomen: Does not appear distended. MSK: Left second digit DIP slightly swollen with slight extensor lag.  Intact flexion strength.  Intact extension strength.  Sensation is intact distally.  Pulses cap refill are intact as well.  Normal hand appearance and motion otherwise.    Lab and Radiology Results No results found for this or any previous visit (from the past 72 hour(s)). Dg Hand Complete Left  Result Date: 12/18/2018 CLINICAL DATA:  Increasing pain at the left second IP joint, post blunt injury to the finger. EXAM: LEFT HAND - COMPLETE 3+ VIEW COMPARISON:  None. FINDINGS: There is an avulsion fracture off of the dorsal surface of the proximal second  phalanx at the DIP joint without significant displacement. Associated soft tissue swelling. IMPRESSION: Avulsion fracture off of the dorsal surface of the proximal second phalanx at the DIP joint without significant displacement. Electronically Signed   By: Ted Mcalpineobrinka  Dimitrova M.D.   On: 12/18/2018 14:13  I personally (independently) visualized and performed the interpretation of the images attached in this note.      Assessment and Plan: 68 y.o. male with left index finger mallet fracture.  Patient does have some retained extension strength which is great news.  After discussion plan for treatment with Stax Splint in neutral position.  Stressed the importance of continuous use of splinting.  Additionally stressed that even with excellent care he is likely to have some residual extensor lag.  Plan to recheck in about 2 weeks.  We will repeat x-ray at that time.   PDMP reviewed during this encounter. No orders of the defined types were placed in this encounter.  Meds ordered this encounter  Medications  . carisoprodol (SOMA) 350 MG tablet    Sig: Take 1 tablet (350 mg total) by mouth 3 (three) times daily as needed.    Dispense:  90 tablet    Refill:  0    Fill on or after 12/22/18    Historical information moved to improve visibility of documentation.  Past Medical History:  Diagnosis Date  . Arthritis   . Crohn's disease (HCC)   .  GERD (gastroesophageal reflux disease)    occasionally has indigestion  . Multiple sclerosis (HCC)    DX 3-4 YRS AGO-- ASYMPTOMATIC  . Neuromuscular disorder ([place])    MS  ON NO MEDS   . Ulcerative colitis ([place])    LAST FLAREUP SPRING 2014   Past Surgical History:  Procedure Laterality Date  . BACK SURGERY    . CERVICAL SPINE SURGERY     FUSION  . ELBOW SURGERY     LEFT  . MAXIMUM ACCESS (MAS)POSTERIOR LUMBAR INTERBODY FUSION (PLIF) 1 LEVEL N/A 07/11/2013   Procedure: FOR MAXIMUM ACCESS (MAS) POSTERIOR LUMBAR INTERBODY FUSION (PLIF) 1 LEVEL LUMBAR  FOUR-FIVE;  Surgeon: Eustace Moore, MD;  Location: [place];  Service: Neurosurgery;  Laterality: N/A;  . THUMB ARTHROSCOPY     LEFT  . TONSILLECTOMY    . TORN ROTATOR     LEFT SHOULDER   Social History   Tobacco Use  . Smoking status: Former Research scientist (life sciences)  . Smokeless tobacco: Never Used  Substance Use Topics  . Alcohol use: No   family history is not on file.  Medications: Current Outpatient Medications  Medication Sig Dispense Refill  . Adalimumab 10 MG/0.2ML PSKT Inject into the skin every 7 (seven) days.    . ARIPiprazole (ABILIFY) 5 MG tablet Take 5 mg by mouth daily.    Marland Kitchen atorvastatin (LIPITOR) 10 MG tablet Take 1 tablet (10 mg total) by mouth daily. 90 tablet 1  . B Complex-Folic Acid (B COMPLEX-VITAMIN B12 PO) Take 1 mL by mouth daily.    Marland Kitchen buPROPion (WELLBUTRIN XL) 300 MG 24 hr tablet Take 300 mg by mouth daily.    . carisoprodol (SOMA) 350 MG tablet Take 1 tablet (350 mg total) by mouth 3 (three) times daily as needed. 90 tablet 0  . escitalopram (LEXAPRO) 10 MG tablet Take 10 mg by mouth daily.    Marland Kitchen gabapentin (NEURONTIN) 800 MG tablet Take 800 mg by mouth every 8 (eight) hours.    Marland Kitchen HYDROmorphone (DILAUDID) 4 MG tablet Take 4 mg by mouth every 8 (eight) hours.    . hydrOXYzine (ATARAX/VISTARIL) 25 MG tablet Take 1 tablet (25 mg total) by mouth 2 (two) times a day. 120 tablet 0  . Multiple Vitamins-Minerals (MULTIVITAMIN WITH MINERALS) tablet Take 1 tablet by mouth daily.    . traZODone (DESYREL) 100 MG tablet Take 1 tablet (100 mg total) by mouth at bedtime. 90 tablet 1  . traZODone (DESYREL) 150 MG tablet Take 1 tablet (150 mg total) by mouth at bedtime. 90 tablet 1  . ranitidine (ZANTAC) 150 MG tablet Take 150 mg by mouth daily as needed for heartburn.    . senna-docusate (SENOKOT-S) 8.6-50 MG per tablet Take 2 tablets by mouth 2 (two) times daily as needed for mild constipation.    . varenicline (CHANTIX PAK) 0.5 MG X 11 & 1 MG X 42 tablet Take one 0.5 mg tablet by  mouth once daily for 3 days, then increase to one 0.5 mg tablet twice daily for 4 days, then increase to one 1 mg tablet twice daily. (Patient not taking: Reported on 12/19/2018) 53 tablet 0   No current facility-administered medications for this visit.    Allergies  Allergen Reactions  . Buspirone Other (See Comments)    "wired up, out of body" "wired up, out of body"   . Diclofenac Other (See Comments)    Stomach upset Stomach upset   . Interferon Beta-1a Other (See Comments)    '  bad reaction" 'bad reaction" 'bad reaction"   . Venlafaxine Other (See Comments)    "felt like a zombie" "felt like a zombie"   . Methocarbamol Other (See Comments)  . Saccharin Nausea Only      Discussed warning signs or symptoms. Please see discharge instructions. Patient expresses understanding.

## 2018-12-19 NOTE — Patient Instructions (Signed)
Thank you for coming in today. You have a mallet fracture of the finger.  This will take about 6 weeks to get better.  Use the splint all the time.  Keep the finger straight when you have the splint off.  Recheck in 2 weeks.  Even with with perfect care the finger may not be completely straight at the end.    Mallet Finger  Mallet finger is an injury that occurs when an object hits the tip of your straightened finger or thumb. It is also known as baseball finger. The blow to your fingertip causes it to bend more than normal, which tears the cord that attaches to the tip of your finger (extensor tendon).  Your extensor tendon is what straightens the end of your finger. If this tendon is damaged, you will not be able to straighten your fingertip. Sometimes, a piece of bone may be pulled away with the tendon (avulsion injury), or the tendon may tear completely. In some cases, surgery may be required to repair the damage. What are the causes? Mallet finger is caused by a hard, direct hit to the tip of your finger or thumb. This injury often happens from getting hit in the finger with a hard ball, such as a baseball. What increases the risk? This injury is more likely to happen if you play a sport that uses a hard ball. What are the signs or symptoms? The main symptom of this injury is the inability to straighten the tip of your finger. You can manually straighten your fingertip with your other hand, but the finger cannot straighten on its own. Other symptoms may include:  Pain.  Swelling.  Bruising.  Blood under the fingernail. How is this diagnosed? Your health care provider may suspect mallet finger if you are not able to extend your fingertip, especially if you recently injured your hand. Your health care provider will do a physical exam. This may include X-rays to see if a piece of bone has been pulled away or if the finger joint has separated (dislocated). How is this treated? Mallet  finger may be treated with:  A splint on your fingertip to keep it straight (extended) while the tendon heals.  Surgery to repair the tendon. This is done in severe cases. This may involve: ? Using a pin or screw to keep your finger extended and your tendon attached. ? Using a piece of tendon from another part of your body (graft) to replace a torn tendon. Follow these instructions at home: If you have a splint:  Wear the splint as told by your health care provider. Remove it only as told by your health care provider.  Loosen the splint if your fingers tingle, become numb, or turn cold and blue.  Keep the splint clean.  If the splint is not waterproof: ? Do not let it get wet. ? Cover it with a watertight covering when you take a bath or a shower.  If you take your splint off to dry it or change it: ? Gently press your finger on a flat surface to keep it straight. Failing to do so may lead to a permanent injury, or force you to wear the splint for a longer period of time. ? Check the skin under the splint. Tell your health care provider if you notice a blister or red and raw skin. Managing pain, stiffness, and swelling   If directed, put ice on the injured area: ? If you have a removable splint,  remove it as told by your health care provider. ? Put ice in a plastic bag. ? Place a towel between your skin and the bag. ? Leave the ice on for 20 minutes, 2-3 times a day.  Move your fingers often to avoid stiffness and to lessen swelling.  Raise (elevate)the injured hand above the level of your heart while you are sitting or lying down. General instructions  Take over-the-counter and prescription medicines only as told by your health care provider.  Do not drive or use heavy machinery while taking prescription pain medicine.  Keep all follow-up visits as told by your health care provider. This is important. Contact a health care provider if:  You have pain or swelling that is  getting worse.  Your finger feels cold.  You cannot extend your finger after treatment.  You notice that the skin under the splint is red, raw, or has a blister. Get help right away if:  Even after loosening your splint, your finger is: ? Very red and swollen. ? White or blue. ? Numb or tingling. Summary  Mallet finger is an injury that occurs from a hard, direct hit to the tip of your finger or thumb.  The blow to your fingertip causes it to bend more than normal, tearing the tendon that straightens the end of your finger. You cannot straighten your fingertip if this tendon is torn.  This injury often happens from getting hit in the finger with a hard ball, such as a baseball.  Treatment will depend on how severe the injury is. You may need to wear a splint to keep the finger straight while it heals. A more severe injury may require surgery to repair the tendon. This information is not intended to replace advice given to you by your health care provider. Make sure you discuss any questions you have with your health care provider. Document Released: 04/29/2000 Document Revised: 05/15/2017 Document Reviewed: 05/15/2017 Elsevier Patient Education  2020 [employer].

## 2019-01-02 ENCOUNTER — Ambulatory Visit (INDEPENDENT_AMBULATORY_CARE_PROVIDER_SITE_OTHER): Payer: Medicare Other

## 2019-01-02 ENCOUNTER — Other Ambulatory Visit: Payer: Self-pay

## 2019-01-02 ENCOUNTER — Ambulatory Visit (INDEPENDENT_AMBULATORY_CARE_PROVIDER_SITE_OTHER): Payer: Medicare Other | Admitting: Family Medicine

## 2019-01-02 ENCOUNTER — Encounter: Payer: Self-pay | Admitting: Family Medicine

## 2019-01-02 VITALS — BP 118/64 | HR 61 | Temp 98.4°F | Wt 191.0 lb

## 2019-01-02 DIAGNOSIS — M20019 Mallet finger of unspecified finger(s): Secondary | ICD-10-CM | POA: Diagnosis not present

## 2019-01-02 DIAGNOSIS — S62639A Displaced fracture of distal phalanx of unspecified finger, initial encounter for closed fracture: Secondary | ICD-10-CM

## 2019-01-02 NOTE — Progress Notes (Signed)
Paul Fowler is a 68 y.o. male who presents to [facility] today for follow-up left index finger mallet fracture.  Patient was seen on August 5 for left second digit DIP mallet fracture.  He was placed into a stax splint.  In the interim he is done pretty well.  He notes pain and swelling has significantly improved.  He has been very consistent with the use of the splint.    ROS:  As above  Exam:  BP 118/64   Pulse 61   Temp 98.4 F (36.9 C) (Oral)   Wt 191 lb (86.6 kg)   BMI 27.41 kg/m  Wt Readings from Last 5 Encounters:  01/02/19 191 lb (86.6 kg)  12/19/18 184 lb (83.5 kg)  12/18/18 184 lb (83.5 kg)  09/28/18 190 lb (86.2 kg)  03/04/16 163 lb (73.9 kg)   General: Well Developed, well nourished, and in no acute distress.  Neuro/Psych: Alert and oriented x3, extra-ocular muscles intact, able to move all 4 extremities, sensation grossly intact. Skin: Warm and dry, no rashes noted.  Respiratory: Not using accessory muscles, speaking in full sentences, trachea midline.  Cardiovascular: Pulses palpable, no extremity edema. Abdomen: Does not appear distended. MSK: Left second digit DIP minimal swelling.  Nontender.  Motion not tested.  Sensation and cap refill are intact distally.    Lab and Radiology Results X-ray images left second digit obtained today personally and independently reviewed Dorsal avulsion fracture at DIP still present.  Alignment and gap not significantly changed.  No significant callus formation per my read is present at this time. Await formal radiology over read    Assessment and Plan: 68 y.o. male with left second digit DIP mallet fracture.  Clinically doing quite well.  Not a lot of healing still present on x-ray at this time.  Plan to continue immobilization.  Discussed the possibility of fibrous union and management strategies from there.  Patient is doing extremely well.  I think is reasonable to check  back in 4 weeks.  X-ray in 2 weeks if clinically doing well not change plan very much therefore we can probably skip the visit as long as he is doing fine.  Precautions discussed precautions and check back in 4 weeks.  I spent 15 minutes with this patient, greater than 50% was face-to-face time counseling regarding fracture treatment strategies next steps and backup plans.Marland Kitchen  PDMP not reviewed this encounter. Orders Placed This Encounter  Procedures  . DG Finger Index Left    Order Specific Question:   Reason for exam:    Answer:   follow up fx    Order Specific Question:   Preferred imaging location?    Answer:   [facility]   No orders of the defined types were placed in this encounter.   Historical information moved to improve visibility of documentation.  Past Medical History:  Diagnosis Date  . Arthritis   . Crohn's disease ([place])   . GERD (gastroesophageal reflux disease)    occasionally has indigestion  . Multiple sclerosis ([facility])    DX 3-4 YRS AGO-- ASYMPTOMATIC  . Neuromuscular disorder ([place])    MS  ON NO MEDS   . Ulcerative colitis ([place])    LAST FLAREUP SPRING 2014   Past Surgical History:  Procedure Laterality Date  . BACK SURGERY    . CERVICAL SPINE SURGERY     FUSION  . ELBOW SURGERY     LEFT  . MAXIMUM ACCESS (  MAS)POSTERIOR LUMBAR INTERBODY FUSION (PLIF) 1 LEVEL N/A 07/11/2013   Procedure: FOR MAXIMUM ACCESS (MAS) POSTERIOR LUMBAR INTERBODY FUSION (PLIF) 1 LEVEL LUMBAR FOUR-FIVE;  Surgeon: Tia Alertavid S Jones, MD;  Location: [facility];  Service: Neurosurgery;  Laterality: N/A;  . THUMB ARTHROSCOPY     LEFT  . TONSILLECTOMY    . TORN ROTATOR     LEFT SHOULDER   Social History   Tobacco Use  . Smoking status: Former Games developermoker  . Smokeless tobacco: Never Used  Substance Use Topics  . Alcohol use: No   family history is not on file.  Medications: Current Outpatient Medications  Medication Sig Dispense Refill  . Adalimumab 10 MG/0.2ML PSKT Inject  into the skin every 7 (seven) days.    . ARIPiprazole (ABILIFY) 5 MG tablet Take 5 mg by mouth daily.    Marland Kitchen. atorvastatin (LIPITOR) 10 MG tablet Take 1 tablet (10 mg total) by mouth daily. 90 tablet 1  . B Complex-Folic Acid (B COMPLEX-VITAMIN B12 PO) Take 1 mL by mouth daily.    Marland Kitchen. buPROPion (WELLBUTRIN XL) 300 MG 24 hr tablet Take 300 mg by mouth daily.    . carisoprodol (SOMA) 350 MG tablet Take 1 tablet (350 mg total) by mouth 3 (three) times daily as needed. 90 tablet 0  . escitalopram (LEXAPRO) 10 MG tablet Take 10 mg by mouth daily.    Marland Kitchen. gabapentin (NEURONTIN) 800 MG tablet Take 800 mg by mouth every 8 (eight) hours.    Marland Kitchen. HYDROmorphone (DILAUDID) 4 MG tablet Take 4 mg by mouth every 8 (eight) hours.    . hydrOXYzine (ATARAX/VISTARIL) 25 MG tablet Take 1 tablet (25 mg total) by mouth 2 (two) times a day. 120 tablet 0  . Multiple Vitamins-Minerals (MULTIVITAMIN WITH MINERALS) tablet Take 1 tablet by mouth daily.    . ranitidine (ZANTAC) 150 MG tablet Take 150 mg by mouth daily as needed for heartburn.    . senna-docusate (SENOKOT-S) 8.6-50 MG per tablet Take 2 tablets by mouth 2 (two) times daily as needed for mild constipation.    . traZODone (DESYREL) 100 MG tablet Take 1 tablet (100 mg total) by mouth at bedtime. 90 tablet 1  . traZODone (DESYREL) 150 MG tablet Take 1 tablet (150 mg total) by mouth at bedtime. 90 tablet 1  . varenicline (CHANTIX PAK) 0.5 MG X 11 & 1 MG X 42 tablet Take one 0.5 mg tablet by mouth once daily for 3 days, then increase to one 0.5 mg tablet twice daily for 4 days, then increase to one 1 mg tablet twice daily. (Patient not taking: Reported on 12/19/2018) 53 tablet 0   No current facility-administered medications for this visit.    Allergies  Allergen Reactions  . Buspirone Other (See Comments)    "wired up, out of body" "wired up, out of body"   . Diclofenac Other (See Comments)    Stomach upset Stomach upset   . Interferon Beta-1a Other (See Comments)     'bad reaction" 'bad reaction" 'bad reaction"   . Venlafaxine Other (See Comments)    "felt like a zombie" "felt like a zombie"   . Methocarbamol Other (See Comments)  . Saccharin Nausea Only      Discussed warning signs or symptoms. Please see discharge instructions. Patient expresses understanding.

## 2019-01-02 NOTE — Patient Instructions (Signed)
Thank you for coming in today. Recheck in 4 weeks. Return sooner if needed.  Continue careful splinting.

## 2019-01-17 ENCOUNTER — Other Ambulatory Visit: Payer: Self-pay | Admitting: Family Medicine

## 2019-01-17 DIAGNOSIS — M545 Low back pain, unspecified: Secondary | ICD-10-CM

## 2019-01-22 NOTE — Progress Notes (Signed)
Subjective:   Paul Fowler is a 68 y.o. male who presents for Medicare Annual/Subsequent preventive examination.  Review of Systems:  No ROS.  Medicare Wellness Virtual Visit.  Visual/audio telehealth visit, UTA vital signs.   See social history for additional risk factors.    Cardiac Risk Factors include: sedentary lifestyle;male gender;advanced age (>4555men, 39>65 women) Sleep patterns: Getting 8-9 hours of sleep a night. Wakes up every now and then to go to bathroom.Wakes up and feels refreshed   Home Safety/Smoke Alarms: Feels safe in home. Smoke alarms in place.  Living environment; Lives alone with his dog Paul Fowler in 2 story home, stairs have handrails on them. Shower is a step over tub combo and grab bars in place. Seat Belt Safety/Bike Helmet: Wears seat belt.    Male:   CCS-  [facility] Dr. Marney Settingandy Peters   PSA- patient reports done within last year- states good       Objective:    Vitals: BP 98/64   Pulse 62   Ht 5\' 10"  (1.778 m)   Wt 187 lb (84.8 kg)   SpO2 94%   BMI 26.83 kg/m   Body mass index is 26.83 kg/m.  Advanced Directives 01/28/2019 09/30/2018 05/02/2017 07/11/2013 07/08/2013  Does Patient Have a Medical Advance Directive? Yes Yes Yes Patient has advance directive, copy not in chart Patient has advance directive, copy not in chart  Type of Advance Directive Healthcare Power of [place];Living will Living will Healthcare Power of [place];Living will Living will -  Does patient want to make changes to medical advance directive? No - Patient declined No - Patient declined - No change requested -  Copy of Healthcare Power of Attorney in Chart? No - copy requested - No - copy requested - -    Tobacco Social History   Tobacco Use  Smoking Status Former Smoker  Smokeless Tobacco Never Used     Counseling given: Not Answered   Clinical Intake:  Pre-visit preparation completed: Yes  Pain : No/denies pain     Nutritional Risks:  None Diabetes: No  How often do you need to have someone help you when you read instructions, pamphlets, or other written materials from your doctor or pharmacy?: 1 - Never What is the last grade level you completed in school?: 14  Interpreter Needed?: No  Information entered by :: Carolin SicksKim , LPN  Past Medical History:  Diagnosis Date  . Arthritis   . Crohn's disease ([facility])   . GERD (gastroesophageal reflux disease)    occasionally has indigestion  . Multiple sclerosis ([facility])    DX 3-4 YRS AGO-- ASYMPTOMATIC  . Neuromuscular disorder ([facility])    MS  ON NO MEDS   . Ulcerative colitis ([facility])    LAST FLAREUP SPRING 2014   Past Surgical History:  Procedure Laterality Date  . BACK SURGERY    . CERVICAL SPINE SURGERY     FUSION  . ELBOW SURGERY     LEFT  . MAXIMUM ACCESS (MAS)POSTERIOR LUMBAR INTERBODY FUSION (PLIF) 1 LEVEL N/A 07/11/2013   Procedure: FOR MAXIMUM ACCESS (MAS) POSTERIOR LUMBAR INTERBODY FUSION (PLIF) 1 LEVEL LUMBAR FOUR-FIVE;  Surgeon: Tia Alertavid S Jones, MD;  Location: [facility];  Service: Neurosurgery;  Laterality: N/A;  . THUMB ARTHROSCOPY     LEFT  . TONSILLECTOMY    . TORN ROTATOR     LEFT SHOULDER   History reviewed. No pertinent family history. Social History   Socioeconomic History  .  Marital status: Divorced    Spouse name: Not on file  . Number of children: 2  . Years of education: 65  . Highest education level: Associate degree: academic program  Occupational History  . Occupation: customer support specialists    Comment: retired  Engineer, production  . Financial resource strain: Not hard at all  . Food insecurity    Worry: Never true    Inability: Never true  . Transportation needs    Medical: No    Non-medical: No  Tobacco Use  . Smoking status: Former Games developer  . Smokeless tobacco: Never Used  Substance and Sexual Activity  . Alcohol use: No  . Drug use: No  . Sexual activity: Not Currently  Lifestyle  . Physical activity    Days per week: 0  days    Minutes per session: 0 min  . Stress: Not at all  Relationships  . Social Musician on phone: Three times a week    Gets together: Once a week    Attends religious service: More than 4 times per year    Active member of club or organization: No    Attends meetings of clubs or organizations: Never    Relationship status: Divorced  Other Topics Concern  . Not on file  Social History Narrative   Run errands   Watch TV    Outpatient Encounter Medications as of 01/28/2019  Medication Sig  . Adalimumab 10 MG/0.2ML PSKT Inject into the skin every 7 (seven) days.  . ARIPiprazole (ABILIFY) 5 MG tablet Take 5 mg by mouth daily.  Marland Kitchen atorvastatin (LIPITOR) 10 MG tablet Take 1 tablet (10 mg total) by mouth daily.  . B Complex-Folic Acid (B COMPLEX-VITAMIN B12 PO) Take 1 mL by mouth daily.  Marland Kitchen buPROPion (WELLBUTRIN XL) 300 MG 24 hr tablet Take 300 mg by mouth daily.  . carisoprodol (SOMA) 350 MG tablet TAKE 1 TABLET (350 MG TOTAL) BY MOUTH 3 (THREE) TIMES DAILY AS NEEDED.  Marland Kitchen escitalopram (LEXAPRO) 10 MG tablet Take 10 mg by mouth daily.  Marland Kitchen gabapentin (NEURONTIN) 800 MG tablet Take 800 mg by mouth every 8 (eight) hours.  Marland Kitchen HYDROmorphone (DILAUDID) 4 MG tablet Take 4 mg by mouth every 8 (eight) hours.  . hydrOXYzine (ATARAX/VISTARIL) 25 MG tablet Take 1 tablet (25 mg total) by mouth 2 (two) times a day.  . traZODone (DESYREL) 100 MG tablet Take 1 tablet (100 mg total) by mouth at bedtime.  . traZODone (DESYREL) 150 MG tablet Take 1 tablet (150 mg total) by mouth at bedtime.  . Multiple Vitamins-Minerals (MULTIVITAMIN WITH MINERALS) tablet Take 1 tablet by mouth daily.  . ranitidine (ZANTAC) 150 MG tablet Take 150 mg by mouth daily as needed for heartburn.  . senna-docusate (SENOKOT-S) 8.6-50 MG per tablet Take 2 tablets by mouth 2 (two) times daily as needed for mild constipation.  . varenicline (CHANTIX PAK) 0.5 MG X 11 & 1 MG X 42 tablet Take one 0.5 mg tablet by mouth once daily  for 3 days, then increase to one 0.5 mg tablet twice daily for 4 days, then increase to one 1 mg tablet twice daily. (Patient not taking: Reported on 12/19/2018)   No facility-administered encounter medications on file as of 01/28/2019.     Activities of Daily Living In your present state of health, do you have any difficulty performing the following activities: 01/28/2019  Hearing? N  Vision? N  Difficulty concentrating or making decisions? N  Walking  or climbing stairs? N  Dressing or bathing? N  Doing errands, shopping? N  Preparing Food and eating ? N  Using the Toilet? N  In the past six months, have you accidently leaked urine? N  Do you have problems with loss of bowel control? N  Managing your Medications? N  Managing your Finances? N  Housekeeping or managing your Housekeeping? N  Some recent data might be hidden    Patient Care Team: Hali Marry, MD as PCP - General (Family Medicine)   Assessment:   This is a routine wellness examination for Ke[place].Physical assessment deferred to PCP.   Exercise Activities and Dietary recommendations Current Exercise Habits: The patient does not participate in regular exercise at present Diet Eats a healthy diet of fruits and protien. Not much on vegetables Breakfast: granola cereal Lunch: sandwich Dinner: Meat. Does eat blueberries every day. Drinks 64 ounce os water a day     Goals    . Exercise 3x per week (30 min per time)     Exercise at least 3 times a week for 30 minutes at a time       Fall Risk Fall Risk  01/28/2019  Falls in the past year? 0  Number falls in past yr: 0  Injury with Fall? 0  Follow up Falls prevention discussed   Is the patient's home free of loose throw rugs in walkways, pet beds, electrical cords, etc?   yes      Grab bars in the bathroom? yes      Handrails on the stairs?   yes      Adequate lighting?   yes   Depression Screen PHQ 2/9 Scores 01/28/2019 09/30/2018  PHQ - 2 Score 0 0     Cognitive Function     6CIT Screen 01/28/2019  What Year? 0 points  What month? 0 points  What time? 0 points  Count back from 20 0 points  Months in reverse 0 points  Repeat phrase 0 points  Total Score 0    Immunization History  Administered Date(s) Administered  . Influenza, High Dose Seasonal PF 02/07/2018  . PPD Test 02/13/2015  . Td 06/27/2007  . Tdap 05/20/2006, 09/06/2016    Screening Tests Health Maintenance  Topic Date Due  . Hepatitis C Screening  May 08, 1951  . COLONOSCOPY  12/06/2001  . PNA vac Low Risk Adult (1 of 2 - PCV13) 12/06/2016  . INFLUENZA VACCINE  12/15/2018  . TETANUS/TDAP  09/07/2026        Plan:      Mr. Ernsberger , Thank you for taking time to come for your Medicare Wellness Visit. I appreciate your ongoing commitment to your health goals. Please review the following plan we discussed and let me know if I can assist you in the future. Please schedule your next medicare wellness visit with me in 1 yr.   These are the goals we discussed: Goals    . Exercise 3x per week (30 min per time)     Exercise at least 3 times a week for 30 minutes at a time       This is a list of the screening recommended for you and due dates:  Health Maintenance  Topic Date Due  .  Hepatitis C: One time screening is recommended by Center for Disease Control  (CDC) for  adults born from [birth year] through 1964.   March 23, 1951  . Colon Cancer Screening  12/06/2001  . Pneumonia vaccines (1 of  2 - PCV13) 12/06/2016  . Flu Shot  12/15/2018  . Tetanus Vaccine  09/07/2026     I have personally reviewed and noted the following in the patient's chart:   . Medical and social history . Use of alcohol, tobacco or illicit drugs  . Current medications and supplements . Functional ability and status . Nutritional status . Physical activity . Advanced directives . List of other physicians . Hospitalizations, surgeries, and ER visits in previous 12  months . Vitals . Screenings to include cognitive, depression, and falls . Referrals and appointments  In addition, I have reviewed and discussed with patient certain preventive protocols, quality metrics, and best practice recommendations. A written personalized care plan for preventive services as well as general preventive health recommendations were provided to patient.     Normand SloopKimberly A , LPN  1/61/09609/14/2020

## 2019-01-28 ENCOUNTER — Ambulatory Visit (INDEPENDENT_AMBULATORY_CARE_PROVIDER_SITE_OTHER): Payer: Medicare Other | Admitting: *Deleted

## 2019-01-28 ENCOUNTER — Other Ambulatory Visit: Payer: Self-pay

## 2019-01-28 VITALS — BP 98/64 | HR 62 | Ht 70.0 in | Wt 187.0 lb

## 2019-01-28 DIAGNOSIS — Z0000 Encounter for general adult medical examination without abnormal findings: Secondary | ICD-10-CM | POA: Diagnosis not present

## 2019-01-28 NOTE — Patient Instructions (Signed)
Mr. Karnes , Thank you for taking time to come for your Medicare Wellness Visit. I appreciate your ongoing commitment to your health goals. Please review the following plan we discussed and let me know if I can assist you in the future. Please schedule your next medicare wellness visit with me in 1 yr.  These are the goals we discussed: Goals    . Exercise 3x per week (30 min per time)     Exercise at least 3 times a week for 30 minutes at a time

## 2019-01-30 ENCOUNTER — Ambulatory Visit (INDEPENDENT_AMBULATORY_CARE_PROVIDER_SITE_OTHER): Payer: Medicare Other

## 2019-01-30 ENCOUNTER — Other Ambulatory Visit: Payer: Self-pay

## 2019-01-30 ENCOUNTER — Encounter: Payer: Self-pay | Admitting: Family Medicine

## 2019-01-30 ENCOUNTER — Ambulatory Visit (INDEPENDENT_AMBULATORY_CARE_PROVIDER_SITE_OTHER): Payer: Medicare Other | Admitting: Family Medicine

## 2019-01-30 VITALS — BP 134/80 | HR 60 | Temp 98.6°F | Wt 187.0 lb

## 2019-01-30 DIAGNOSIS — S62639A Displaced fracture of distal phalanx of unspecified finger, initial encounter for closed fracture: Secondary | ICD-10-CM

## 2019-01-30 DIAGNOSIS — M20019 Mallet finger of unspecified finger(s): Secondary | ICD-10-CM

## 2019-01-30 NOTE — Patient Instructions (Signed)
Thank you for coming in today. Ok to start motion of the finger.  Use passive normal motion.  Ok to start modeling clay pinch and push exercises.  If not getting better let me know next step is PT.   As for the right 5th toe you may have broken it.  Proceed to buddy tape the 5th toe to the 4th toe especially when barefoot.   \If not better we will do an xray.   Recheck as needed.    How to Buddy Tape Buddy taping refers to taping an injured finger or toe to an uninjured finger or toe that is next to it. This protects the injured finger or toe and keeps it from moving while the injury heals. You may buddy tape a finger or toe if you have a minor sprain. Your health care provider may buddy tape your finger or toe if you have a sprain, dislocation, or fracture. You may be told to replace your buddy taping as needed. What are the risks? Generally, buddy taping is safe. However, problems may occur, such as:  Skin injury or infection.  Reduced blood flow to the finger or toe.  Skin reaction to the tape. Do not buddy tape your toe if you have diabetes. Do not buddy tape if you know that you have an allergy to adhesives or surgical tape. Supplies needed:  Gauze pad, cotton, or cloth.  Tape. This may be called first-aid tape, surgical tape, or medical tape. How to buddy tape Before buddy taping Lessen any pain and swelling with rest, icing, and elevation:  Avoid activities that cause pain.  If directed, put ice on the injured area: ? Put ice in a plastic bag. ? Place a towel between your skin and the bag. ? Leave the ice on for 20 minutes, 2-3 times a day.  Raise (elevate) your hand or foot above the level of your heart while you are sitting or lying down.  Buddy taping procedure   Clean and dry your finger or toe as told by your health care provider.  Place a gauze pad or a piece of cloth or cotton between your injured finger or toe and the uninjured finger or toe.  Use  tape to wrap around both fingers or toes so your injured finger or toe is secured to the uninjured finger or toe. ? The tape should be snug, but not tight. ? Make sure the ends of the piece of tape overlap. ? Avoid placing tape directly over the joint.  Change the tape and the padding as told by your health care provider. Remove and replace the tape or padding if it becomes loose, worn, dirty, or wet. After buddy taping  Watch the buddy-taped area and always remove buddy taping if your: ? Pain gets worse. ? Fingers or toes turn pale or blue. ? Skin becomes irritated. Follow these instructions at home:  Take over-the-counter and prescription medicines only as told by your health care provider.  Return to your normal activities as told by your health care provider. Ask your health care provider what activities are safe for you. Contact a health care provider if:  You have pain, swelling, or bruising that lasts longer than 3 days.  You have a fever.  Your skin is red, cracked, or irritated. Get help right away if:  The injured area becomes cold, numb, or pale.  You have severe pain, swelling, bruising, or loss of movement in your finger or toe.  Your finger  or toe changes shape (deformity). Summary  Buddy taping refers to taping an injured finger or toe to an uninjured finger or toe that is next to it.  You may buddy tape a finger or toe if you have a minor sprain.  Take over-the-counter and prescription medicines only as told by your health care provider. This information is not intended to replace advice given to you by your health care provider. Make sure you discuss any questions you have with your health care provider. Document Released: 06/09/2004 Document Revised: 08/23/2018 Document Reviewed: 05/14/2018 Elsevier Patient Education  2020 [employer].

## 2019-01-30 NOTE — Progress Notes (Signed)
Ledell NossKenneth T Hunsucker is a 68 y.o. male who presents to [facility] today for follow-up left index finger mallet fracture.  Patient suffered mallet fracture was seen for evaluation 6 weeks ago.  He has been immobilized with Stax splint since.  He feels well.  He also notes that he stubbed his right fifth toe about 2 to 3 weeks ago.  He notes some pain and swelling that is improving.  He wants to know if he needs any specific treatment or evaluation for this.  He is able to walk normally without a lot of pain.  He would like to avoid x-ray first toe if possible.     ROS:  As above  Exam:  BP 134/80   Pulse 60   Temp 98.6 F (37 C) (Oral)   Wt 187 lb (84.8 kg)   BMI 26.83 kg/m  Wt Readings from Last 5 Encounters:  01/30/19 187 lb (84.8 kg)  01/28/19 187 lb (84.8 kg)  01/02/19 191 lb (86.6 kg)  12/19/18 184 lb (83.5 kg)  12/18/18 184 lb (83.5 kg)   General: Well Developed, well nourished, and in no acute distress.  Neuro/Psych: Alert and oriented x3, extra-ocular muscles intact, able to move all 4 extremities, sensation grossly intact. Skin: Warm and dry, no rashes noted.  Respiratory: Not using accessory muscles, speaking in full sentences, trachea midline.  Cardiovascular: Pulses palpable, no extremity edema. Abdomen: Does not appear distended. MSK: Left index finger: Slightly swollen at DIP however no significant erythema or significant deformity. Nontender. Intact flexion strength. Intact strength to extension at DIP.Marland Kitchen.  No severe extensor lag. Sensation is intact distally as is capillary refill.  Right fifth toe still erythematous and swollen.  No skin breakdown. Tender palpation at the distal aspect of toe.  Nontender at MTP and proximally into the foot.     Lab and Radiology Results X-ray images left index finger obtained today personally independently reviewed Significant reabsorption of avulsion fracture fragment seen on  lateral view compared to prior x-ray August 19. Await formal radiology review    Assessment and Plan: 68 y.o. male with left index finger mallet fracture status post 6 weeks.  Clinically doing well.  X-ray shows significant healing. If doing well clinically no need for specific further management.  Plan to proceed with home exercise program to improve range of motion if not improving neck step would be physical therapy.  Patient will let me know.  As for the toe injury to his right fifth toe it possible that he does have a fracture.  He declines x-ray to fully evaluate it today.  Plan to proceed with buddy tape and watchful waiting if not improving next up would be x-ray.   PDMP not reviewed this encounter. No orders of the defined types were placed in this encounter.  No orders of the defined types were placed in this encounter.   Historical information moved to improve visibility of documentation.  Past Medical History:  Diagnosis Date  . Arthritis   . Crohn's disease (HCC)   . GERD (gastroesophageal reflux disease)    occasionally has indigestion  . Multiple sclerosis (HCC)    DX 3-4 YRS AGO-- ASYMPTOMATIC  . Neuromuscular disorder (HCC)    MS  ON NO MEDS   . Ulcerative colitis (HCC)    LAST FLAREUP SPRING 2014   Past Surgical History:  Procedure Laterality Date  . BACK SURGERY    . CERVICAL SPINE SURGERY  FUSION  . ELBOW SURGERY     LEFT  . MAXIMUM ACCESS (MAS)POSTERIOR LUMBAR INTERBODY FUSION (PLIF) 1 LEVEL N/A 07/11/2013   Procedure: FOR MAXIMUM ACCESS (MAS) POSTERIOR LUMBAR INTERBODY FUSION (PLIF) 1 LEVEL LUMBAR FOUR-FIVE;  Surgeon: Eustace Moore, MD;  Location: [facility];  Service: Neurosurgery;  Laterality: N/A;  . THUMB ARTHROSCOPY     LEFT  . TONSILLECTOMY    . TORN ROTATOR     LEFT SHOULDER   Social History   Tobacco Use  . Smoking status: Former Research scientist (life sciences)  . Smokeless tobacco: Never Used  Substance Use Topics  . Alcohol use: No   family history is  not on file.  Medications: Current Outpatient Medications  Medication Sig Dispense Refill  . Adalimumab 10 MG/0.2ML PSKT Inject into the skin every 7 (seven) days.    . ARIPiprazole (ABILIFY) 5 MG tablet Take 5 mg by mouth daily.    Marland Kitchen atorvastatin (LIPITOR) 10 MG tablet Take 1 tablet (10 mg total) by mouth daily. 90 tablet 1  . B Complex-Folic Acid (B COMPLEX-VITAMIN B12 PO) Take 1 mL by mouth daily.    Marland Kitchen buPROPion (WELLBUTRIN XL) 300 MG 24 hr tablet Take 300 mg by mouth daily.    . carisoprodol (SOMA) 350 MG tablet TAKE 1 TABLET (350 MG TOTAL) BY MOUTH 3 (THREE) TIMES DAILY AS NEEDED. 90 tablet 5  . escitalopram (LEXAPRO) 10 MG tablet Take 10 mg by mouth daily.    Marland Kitchen gabapentin (NEURONTIN) 800 MG tablet Take 800 mg by mouth every 8 (eight) hours.    Marland Kitchen HYDROmorphone (DILAUDID) 4 MG tablet Take 4 mg by mouth every 8 (eight) hours.    . hydrOXYzine (ATARAX/VISTARIL) 25 MG tablet Take 1 tablet (25 mg total) by mouth 2 (two) times a day. 120 tablet 0  . Multiple Vitamins-Minerals (MULTIVITAMIN WITH MINERALS) tablet Take 1 tablet by mouth daily.    . ranitidine (ZANTAC) 150 MG tablet Take 150 mg by mouth daily as needed for heartburn.    . senna-docusate (SENOKOT-S) 8.6-50 MG per tablet Take 2 tablets by mouth 2 (two) times daily as needed for mild constipation.    . traZODone (DESYREL) 100 MG tablet Take 1 tablet (100 mg total) by mouth at bedtime. 90 tablet 1  . traZODone (DESYREL) 150 MG tablet Take 1 tablet (150 mg total) by mouth at bedtime. 90 tablet 1  . varenicline (CHANTIX PAK) 0.5 MG X 11 & 1 MG X 42 tablet Take one 0.5 mg tablet by mouth once daily for 3 days, then increase to one 0.5 mg tablet twice daily for 4 days, then increase to one 1 mg tablet twice daily. (Patient not taking: Reported on 12/19/2018) 53 tablet 0   No current facility-administered medications for this visit.    Allergies  Allergen Reactions  . Buspirone Other (See Comments)    "wired up, out of body" "wired up,  out of body"   . Diclofenac Other (See Comments)    Stomach upset Stomach upset   . Interferon Beta-1a Other (See Comments)    'bad reaction" 'bad reaction" 'bad reaction"   . Venlafaxine Other (See Comments)    "felt like a zombie" "felt like a zombie"   . Methocarbamol Other (See Comments)  . Saccharin Nausea Only      Discussed warning signs or symptoms. Please see discharge instructions. Patient expresses understanding.

## 2019-02-13 ENCOUNTER — Other Ambulatory Visit: Payer: Self-pay | Admitting: Family Medicine

## 2019-03-26 ENCOUNTER — Other Ambulatory Visit: Payer: Self-pay | Admitting: Family Medicine

## 2019-04-18 ENCOUNTER — Other Ambulatory Visit: Payer: Self-pay | Admitting: Family Medicine

## 2019-05-12 ENCOUNTER — Other Ambulatory Visit: Payer: Self-pay | Admitting: Family Medicine

## 2019-05-13 ENCOUNTER — Other Ambulatory Visit: Payer: Self-pay | Admitting: Family Medicine

## 2019-05-13 ENCOUNTER — Telehealth: Payer: Self-pay | Admitting: Family Medicine

## 2019-05-13 DIAGNOSIS — F411 Generalized anxiety disorder: Secondary | ICD-10-CM

## 2019-05-13 MED ORDER — ATORVASTATIN CALCIUM 10 MG PO TABS: 10.0000 mg | ORAL_TABLET | Freq: Every day | ORAL | 1 refills | Status: DC

## 2019-05-13 NOTE — Telephone Encounter (Signed)
Called patient and LM on VM to return call in regards to this refill. KG LPN

## 2019-05-13 NOTE — Telephone Encounter (Signed)
Looks like he just started coming here in May.  The request is for 20 mg Lexapro but we have on file that he is currently on 10 mg.  We will need to clarify this and he does need to schedule some type of follow-up appointment in January.

## 2019-05-13 NOTE — Telephone Encounter (Signed)
Patient called and is in need of a refill of his Lexapro. I do not see that we have a request sent from [facility] for this. His gabapentin was sent to the pharmacy already. Please advise.

## 2019-05-13 NOTE — Telephone Encounter (Signed)
This patient is seeing a lot of doctors including family medicine. Do you want to fill this?

## 2019-05-14 NOTE — Telephone Encounter (Signed)
Please see previous note in regards to dosage.

## 2019-05-15 ENCOUNTER — Encounter: Payer: Self-pay | Admitting: Family Medicine

## 2019-05-15 ENCOUNTER — Other Ambulatory Visit: Payer: Self-pay | Admitting: Family Medicine

## 2019-05-15 NOTE — Telephone Encounter (Signed)
Historical provider. He is due for a follow up. No appointments have been scheduled. He did confirm his dose on a MyChart message.

## 2019-05-15 NOTE — Telephone Encounter (Signed)
Please schedule patient for a follow-up appointment. Thanks.

## 2019-05-15 NOTE — Telephone Encounter (Signed)
Left message for patient to call back and clarify which dose he was taking on his Lexapro. Waiting on call back.

## 2019-05-15 NOTE — Telephone Encounter (Signed)
Finished on a refill encounter.

## 2019-05-15 NOTE — Telephone Encounter (Signed)
Which note are you referring to? I am not finding this for this patient. Please advise.

## 2019-05-16 NOTE — Telephone Encounter (Signed)
See Mychart note 

## 2019-05-27 ENCOUNTER — Other Ambulatory Visit: Payer: Self-pay | Admitting: Family Medicine

## 2019-05-28 NOTE — Telephone Encounter (Signed)
He is on the Lexapro  Dose. KG LPN

## 2019-05-28 NOTE — Telephone Encounter (Signed)
He is on the Trazadone 150mg  dose

## 2019-05-30 ENCOUNTER — Ambulatory Visit (INDEPENDENT_AMBULATORY_CARE_PROVIDER_SITE_OTHER): Payer: Medicare Other | Admitting: Physician Assistant

## 2019-05-30 ENCOUNTER — Encounter: Payer: Self-pay | Admitting: Physician Assistant

## 2019-05-30 VITALS — Ht 70.0 in | Wt 187.0 lb

## 2019-05-30 DIAGNOSIS — M4802 Spinal stenosis, cervical region: Secondary | ICD-10-CM | POA: Diagnosis not present

## 2019-05-30 DIAGNOSIS — Z87891 Personal history of nicotine dependence: Secondary | ICD-10-CM | POA: Diagnosis not present

## 2019-05-30 DIAGNOSIS — G8929 Other chronic pain: Secondary | ICD-10-CM

## 2019-05-30 DIAGNOSIS — M545 Low back pain: Secondary | ICD-10-CM | POA: Diagnosis not present

## 2019-05-30 DIAGNOSIS — G4700 Insomnia, unspecified: Secondary | ICD-10-CM | POA: Diagnosis not present

## 2019-05-30 DIAGNOSIS — Z981 Arthrodesis status: Secondary | ICD-10-CM

## 2019-05-30 MED ORDER — CARISOPRODOL 350 MG PO TABS: 350.0000 mg | ORAL_TABLET | Freq: Three times a day (TID) | ORAL | 5 refills | Status: DC | PRN

## 2019-05-30 MED ORDER — TRAZODONE HCL 100 MG PO TABS: 200.0000 mg | ORAL_TABLET | Freq: Every day | ORAL | 1 refills | Status: DC

## 2019-05-30 NOTE — Progress Notes (Signed)
Needs refills of Trazodone - he states he takes 100 mg and 150 mg together at night  [id number] completed. (0)

## 2019-05-30 NOTE — Progress Notes (Addendum)
Patient ID: Paul Fowler, male   DOB: February 15, 1951, 69 y.o.   MRN: [id number] .Marland KitchenVirtual Visit via Video Note  I connected with Paul Fowler on 05/30/19 at 11:10 AM EST by a video enabled telemedicine application and verified that I am speaking with the correct person using two identifiers.  Location: Patient: home Provider: clinic   I discussed the limitations of evaluation and management by telemedicine and the availability of in person appointments. The patient expressed understanding and agreed to proceed.  History of Present Illness: Patient is a 69 year old male with Crohn's disease and chronic low back pain status post lumbar spinal fusion, anxiety, insomnia who calls into the clinic for medication refill.   Patient sees pain clinic and on Dilaudid.  He does request his refill for Soma.  Last refill was for 6 months and given by Dr. Denyse Amass.  He has not seen Dr. Glade Lloyd in a while for follow-up.  He is doing well on current dose.  He is doing well with his sleep.  He is currently on 250 mg of trazodone.  He has been taking the 100 mg and 150 mg tablet.  He wonders if there is an easier way to get this refilled.  He has no concerns or complaints.  He just recently got his Lexapro refilled.  He is doing well on Lexapro, Wellbutrin, Abilify regimen.  .. Active Ambulatory Problems    Diagnosis Date Noted  . S/P lumbar spinal fusion 07/11/2013  . History of opioid abuse (HCC) 05/26/2017  . History of immunosuppression therapy 09/10/2012  . Foraminal stenosis of cervical region 12/08/2016  . ED (erectile dysfunction) 09/28/2018  . 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 Crohn's disease (HCC) 09/28/2018  . Anxiety disorder 11/29/2016  . 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 Tobacco abuse 09/28/2018   Resolved Ambulatory Problems    Diagnosis Date Noted  . No Resolved Ambulatory Problems   Past Medical History:  Diagnosis Date  . Arthritis   . GERD (gastroesophageal reflux  disease)   . Multiple sclerosis (HCC)   . Neuromuscular disorder (HCC)   . Ulcerative colitis (HCC)    Reviewed med, allergy, problem list.     Observations/Objective: NO acute distress. Normal mood and appearance.   .. Today's Vitals   05/30/19 0825  Weight: 187 lb (84.8 kg)  Height: 5\' 10"  (1.778 m)   Body mass index is 26.83 kg/m.   Assessment and Plan: Marland KitchenDaud was seen today for medication management.  Diagnoses and all orders for this visit:  Insomnia, unspecified type -     traZODone (DESYREL) 100 MG tablet; Take 2 tablets (200 mg total) by mouth at bedtime.  Low back pain -     carisoprodol (SOMA) 350 MG tablet; Take 1 tablet (350 mg total) by mouth 3 (three) times daily as needed.  Foraminal stenosis of cervical region -     carisoprodol (SOMA) 350 MG tablet; Take 1 tablet (350 mg total) by mouth 3 (three) times daily as needed.  S/P lumbar spinal fusion -     carisoprodol (SOMA) 350 MG tablet; Take 1 tablet (350 mg total) by mouth 3 (three) times daily as needed.   Patient does need refills today.  I do see where Iantha Fallen was refilled 6 months ago.  I will send refill over for patient for the next 2 months.  I strongly recommended to follow-up with Dr. Tresa Garter for all refills.  He is on 250 mg of trazodone nightly for sleep.  He is  doing well.  He is wanting a more simplified regimen.  I recommend decreasing his trazodone dose to 200 mg at bedtime.  He is not on a lot of sedating regimen.  He has been on this for a while so perhaps he does not need a stronger dose at this time.  He does agree to this change.  We will send over 100 mg to take 2 tablets at bedtime.  Once again I reiterated to follow-up with his PCP for medication management and follow-up.  Spent 18 minutes with patient and in chart review.    Follow Up Instructions:    I discussed the assessment and treatment plan with the patient. The patient was provided an opportunity to ask questions and all  were answered. The patient agreed with the plan and demonstrated an understanding of the instructions.   The patient was advised to call back or seek an in-person evaluation if the symptoms worsen or if the condition fails to improve as anticipated.   Iran Planas, PA-C

## 2019-07-15 ENCOUNTER — Other Ambulatory Visit: Payer: Self-pay | Admitting: Family Medicine

## 2019-07-26 ENCOUNTER — Other Ambulatory Visit: Payer: Self-pay | Admitting: Family Medicine

## 2019-07-26 DIAGNOSIS — F329 Major depressive disorder, single episode, unspecified: Secondary | ICD-10-CM

## 2019-08-23 ENCOUNTER — Other Ambulatory Visit: Payer: Self-pay | Admitting: Family Medicine

## 2019-08-29 ENCOUNTER — Other Ambulatory Visit: Payer: Self-pay | Admitting: Family Medicine

## 2019-09-24 ENCOUNTER — Other Ambulatory Visit: Payer: Self-pay | Admitting: *Deleted

## 2019-09-24 MED ORDER — GABAPENTIN 800 MG PO TABS: 800.0000 mg | ORAL_TABLET | Freq: Three times a day (TID) | ORAL | 0 refills | Status: DC

## 2019-10-06 ENCOUNTER — Other Ambulatory Visit: Payer: Self-pay | Admitting: Family Medicine

## 2019-10-07 ENCOUNTER — Other Ambulatory Visit: Payer: Self-pay | Admitting: *Deleted

## 2019-10-07 MED ORDER — ESCITALOPRAM OXALATE 20 MG PO TABS: 20.0000 mg | ORAL_TABLET | Freq: Every day | ORAL | 0 refills | Status: DC

## 2019-10-25 ENCOUNTER — Other Ambulatory Visit: Payer: Self-pay | Admitting: *Deleted

## 2019-10-26 ENCOUNTER — Other Ambulatory Visit: Payer: Self-pay | Admitting: Family Medicine

## 2019-11-11 ENCOUNTER — Other Ambulatory Visit: Payer: Self-pay | Admitting: Physician Assistant

## 2019-11-11 ENCOUNTER — Other Ambulatory Visit: Payer: Self-pay

## 2019-11-11 DIAGNOSIS — G4700 Insomnia, unspecified: Secondary | ICD-10-CM

## 2019-11-11 MED ORDER — TRAZODONE HCL 100 MG PO TABS: 200.0000 mg | ORAL_TABLET | Freq: Every day | ORAL | 0 refills | Status: DC

## 2019-11-15 ENCOUNTER — Other Ambulatory Visit: Payer: Self-pay | Admitting: Family Medicine

## 2019-12-02 ENCOUNTER — Encounter: Payer: Self-pay | Admitting: Family Medicine

## 2019-12-02 ENCOUNTER — Other Ambulatory Visit: Payer: Self-pay

## 2019-12-02 ENCOUNTER — Ambulatory Visit (INDEPENDENT_AMBULATORY_CARE_PROVIDER_SITE_OTHER): Payer: Medicare Other | Admitting: Family Medicine

## 2019-12-02 VITALS — BP 119/69 | HR 62 | Ht 70.0 in | Wt 191.0 lb

## 2019-12-02 DIAGNOSIS — Z981 Arthrodesis status: Secondary | ICD-10-CM

## 2019-12-02 DIAGNOSIS — Z87891 Personal history of nicotine dependence: Secondary | ICD-10-CM

## 2019-12-02 DIAGNOSIS — M545 Low back pain, unspecified: Secondary | ICD-10-CM

## 2019-12-02 DIAGNOSIS — E782 Mixed hyperlipidemia: Secondary | ICD-10-CM | POA: Diagnosis not present

## 2019-12-02 DIAGNOSIS — G8929 Other chronic pain: Secondary | ICD-10-CM

## 2019-12-02 DIAGNOSIS — M4802 Spinal stenosis, cervical region: Secondary | ICD-10-CM

## 2019-12-02 DIAGNOSIS — G4700 Insomnia, unspecified: Secondary | ICD-10-CM

## 2019-12-02 DIAGNOSIS — F419 Anxiety disorder, unspecified: Secondary | ICD-10-CM | POA: Diagnosis not present

## 2019-12-02 MED ORDER — ESCITALOPRAM OXALATE 20 MG PO TABS: 20.0000 mg | ORAL_TABLET | Freq: Every day | ORAL | 1 refills | Status: DC

## 2019-12-02 MED ORDER — TRAZODONE HCL 100 MG PO TABS: 200.0000 mg | ORAL_TABLET | Freq: Every day | ORAL | 1 refills | Status: DC

## 2019-12-02 MED ORDER — GABAPENTIN 800 MG PO TABS: 800.0000 mg | ORAL_TABLET | Freq: Three times a day (TID) | ORAL | 1 refills | Status: DC

## 2019-12-02 MED ORDER — CARISOPRODOL 350 MG PO TABS: 350.0000 mg | ORAL_TABLET | Freq: Three times a day (TID) | ORAL | 5 refills | Status: DC | PRN

## 2019-12-02 NOTE — Progress Notes (Signed)
Established Patient Office Visit  Subjective:  Patient ID: Paul Fowler, male    DOB: [birth year]  Age: 69 y.o. MRN: [id number]  CC:  Chief Complaint  Patient presents with  . Medication Refill    trazodone    HPI DANI DANIS presents for   F/U Anxiety - currently on Lexapro and Wellbutrin.  He is doing well and is happy with his current regimen.  He sees Dr. Kriste Basque for chronic pain management for foraminal stenosis of the cervical spine and chronic low back pain status post spinal fusion.  He is currently on hydromorphone.  We have prescribed his Manuela Neptune here.  He did let me know that he actually quit smoking about 6 years ago and is concerned about the possibility of lung cancer and would like to have a screening performed.  Hyperlipidemia - tolerating stating well with no myalgias or significant side effects.  Lab Results  Component Value Date   CHOL 158 11/05/2018   HDL 42 11/05/2018   LDLCALC 94 11/05/2018   TRIG 120 11/05/2018   CHOLHDL 3.8 11/05/2018       Past Medical History:  Diagnosis Date  . Arthritis   . Crohn's disease ([place])   . GERD (gastroesophageal reflux disease)    occasionally has indigestion  . Multiple sclerosis (HCC)    DX 3-4 YRS AGO-- ASYMPTOMATIC  . Neuromuscular disorder ([place])    MS  ON NO MEDS   . Ulcerative colitis ([place])    LAST FLAREUP SPRING 2014    Past Surgical History:  Procedure Laterality Date  . BACK SURGERY    . CERVICAL SPINE SURGERY     FUSION  . ELBOW SURGERY     LEFT  . MAXIMUM ACCESS (MAS)POSTERIOR LUMBAR INTERBODY FUSION (PLIF) 1 LEVEL N/A 07/11/2013   Procedure: FOR MAXIMUM ACCESS (MAS) POSTERIOR LUMBAR INTERBODY FUSION (PLIF) 1 LEVEL LUMBAR FOUR-FIVE;  Surgeon: Eustace Moore, MD;  Location: [facility];  Service: Neurosurgery;  Laterality: N/A;  . THUMB ARTHROSCOPY     LEFT  . TONSILLECTOMY    . TORN ROTATOR     LEFT SHOULDER    No family history on file.  Social History   Socioeconomic  History  . Marital status: Divorced    Spouse name: Not on file  . Number of children: 2  . Years of education: 50  . Highest education level: Associate degree: academic program  Occupational History  . Occupation: customer support specialists    Comment: retired  Tobacco Use  . Smoking status: Former Research scientist (life sciences)  . Smokeless tobacco: Never Used  Vaping Use  . Vaping Use: Every day  Substance and Sexual Activity  . Alcohol use: No  . Drug use: No  . Sexual activity: Not Currently  Other Topics Concern  . Not on file  Social History Narrative   Run errands   Watch TV   Social Determinants of Health   Financial Resource Strain: Low Risk   . Difficulty of Paying Living Expenses: Not hard at all  Food Insecurity: No Food Insecurity  . Worried About Charity fundraiser in the Last Year: Never true  . Ran Out of Food in the Last Year: Never true  Transportation Needs: No Transportation Needs  . Lack of Transportation (Medical): No  . Lack of Transportation (Non-Medical): No  Physical Activity: Inactive  . Days of Exercise per Week: 0 days  . Minutes of Exercise per Session: 0 min  Stress: No Stress  Concern Present  . Feeling of Stress : Not at all  Social Connections: Moderately Isolated  . Frequency of Communication with Friends and Family: Three times a week  . Frequency of Social Gatherings with Friends and Family: Once a week  . Attends Religious Services: More than 4 times per year  . Active Member of Clubs or Organizations: No  . Attends Archivist Meetings: Never  . Marital Status: Divorced  Human resources officer Violence: Not At Risk  . Fear of Current or Ex-Partner: No  . Emotionally Abused: No  . Physically Abused: No  . Sexually Abused: No    Outpatient Medications Prior to Visit  Medication Sig Dispense Refill  . Adalimumab 10 MG/0.2ML PSKT Inject into the skin every 7 (seven) days.    . ARIPiprazole (ABILIFY) 5 MG tablet TAKE 1 TABLET BY MOUTH EVERY DAY  90 tablet 3  . atorvastatin (LIPITOR) 10 MG tablet Take 1 tablet (10 mg total) by mouth daily. 90 tablet 1  . b complex vitamins tablet Take 1 tablet by mouth daily.    Marland Kitchen buPROPion (WELLBUTRIN XL) 300 MG 24 hr tablet TAKE 1 TABLET BY MOUTH EVERY DAY 90 tablet 3  . HYDROmorphone (DILAUDID) 4 MG tablet Take 4 mg by mouth every 8 (eight) hours.    . hydrOXYzine (ATARAX/VISTARIL) 25 MG tablet TAKE 1 TABLET (25 MG TOTAL) BY MOUTH 2 (TWO) TIMES DAILY AS NEEDED 180 tablet 0  . NARCAN 4 MG/0.1ML LIQD nasal spray kit     . carisoprodol (SOMA) 350 MG tablet Take 1 tablet (350 mg total) by mouth 3 (three) times daily as needed. 90 tablet 5  . escitalopram (LEXAPRO) 20 MG tablet TAKE 1 TABLET BY MOUTH EVERY DAY 90 tablet 0  . gabapentin (NEURONTIN) 800 MG tablet TAKE 1 TABLET THREE TIMES DAILY 90 tablet 0  . traZODone (DESYREL) 100 MG tablet Take 2 tablets (200 mg total) by mouth at bedtime. 60 tablet 0  . B Complex-Folic Acid (B COMPLEX-VITAMIN B12 PO) Take 1 mL by mouth daily.     No facility-administered medications prior to visit.    Allergies  Allergen Reactions  . Buspirone Other (See Comments)    "wired up, out of body" "wired up, out of body"   . Diclofenac Other (See Comments)    Stomach upset Stomach upset   . Interferon Beta-1a Other (See Comments)    'bad reaction" 'bad reaction" 'bad reaction"   . Venlafaxine Other (See Comments)    "felt like a zombie" "felt like a zombie"   . Methocarbamol Other (See Comments)  . Saccharin Nausea Only    ROS Review of Systems    Objective:    Physical Exam Constitutional:      Appearance: He is well-developed.  HENT:     Head: Normocephalic and atraumatic.  Cardiovascular:     Rate and Rhythm: Normal rate and regular rhythm.     Heart sounds: Normal heart sounds.  Pulmonary:     Effort: Pulmonary effort is normal.     Breath sounds: Normal breath sounds.  Skin:    General: Skin is warm and dry.  Neurological:     Mental  Status: He is alert and oriented to person, place, and time.  Psychiatric:        Behavior: Behavior normal.     BP 119/69   Pulse 62   Ht '5\' 10"'$  (1.778 m)   Wt 191 lb (86.6 kg)   SpO2 95%   BMI  27.41 kg/m  Wt Readings from Last 3 Encounters:  12/02/19 191 lb (86.6 kg)  05/30/19 187 lb (84.8 kg)  01/30/19 187 lb (84.8 kg)     Health Maintenance Due  Topic Date Due  . Hepatitis C Screening  Never done    There are no preventive care reminders to display for this patient.  No results found for: TSH Lab Results  Component Value Date   WBC 7.0 07/08/2013   HGB 14.9 07/08/2013   HCT 43.4 07/08/2013   MCV 92.1 07/08/2013   PLT 250 07/08/2013   Lab Results  Component Value Date   NA 141 07/08/2013   K 4.0 07/08/2013   CO2 28 07/08/2013   GLUCOSE 86 07/08/2013   BUN 9 07/08/2013   CREATININE 0.91 07/08/2013   CALCIUM 9.6 07/08/2013   Lab Results  Component Value Date   CHOL 158 11/05/2018   Lab Results  Component Value Date   HDL 42 11/05/2018   Lab Results  Component Value Date   LDLCALC 94 11/05/2018   Lab Results  Component Value Date   TRIG 120 11/05/2018   Lab Results  Component Value Date   CHOLHDL 3.8 11/05/2018   No results found for: HGBA1C    Assessment & Plan:   Problem List Items Addressed This Visit      Musculoskeletal and Integument   Foraminal stenosis of cervical region   Relevant Medications   carisoprodol (SOMA) 350 MG tablet   Dysbetalipoproteinemia - Primary    Due to recheck lipids.  Will make sure refills are sent to pharmacy as well.      Relevant Orders   Lipid panel   COMPLETE METABOLIC PANEL WITH GFR     Other   S/P lumbar spinal fusion   Relevant Medications   carisoprodol (SOMA) 350 MG tablet   Insomnia    Doing well on trazodone. Refill sent to mail order pharmacy for 6 months worth. Plan to follow-up in 6 months.      Relevant Medications   traZODone (DESYREL) 100 MG tablet   Chronic bilateral low  back pain    Refilled his Manuela Neptune which he takes chronically.  Gust with him that at some point he may want to have Dr. Selinda Orion take over this prescription.      Relevant Medications   carisoprodol (SOMA) 350 MG tablet   Anxiety disorder    He is happy with his current regimen with Lexapro and using trazodone for sleep.  We will send refills to the pharmacy.      Relevant Medications   traZODone (DESYREL) 100 MG tablet   escitalopram (LEXAPRO) 20 MG tablet    Other Visit Diagnoses    Former smoker       Relevant Orders   Ambulatory Referral for Lung Cancer Scre   VAS [place] AAA DUPLEX      He is a former smoker and we discussed getting an aortic ultrasound to evaluate for aortic aneurysm for once in a lifetime screening as well as getting a lung cancer screening which I do think he is a candidate for.  The last time he smoked was about 6 years ago.  Smoked on and off over his lifetime.  Meds ordered this encounter  Medications  . traZODone (DESYREL) 100 MG tablet    Sig: Take 2 tablets (200 mg total) by mouth at bedtime.    Dispense:  180 tablet    Refill:  1  . gabapentin (NEURONTIN) 800 MG  tablet    Sig: Take 1 tablet (800 mg total) by mouth 3 (three) times daily.    Dispense:  270 tablet    Refill:  1  . carisoprodol (SOMA) 350 MG tablet    Sig: Take 1 tablet (350 mg total) by mouth 3 (three) times daily as needed.    Dispense:  90 tablet    Refill:  5    Not to exceed 5 additional fills before 06/17/2019  . escitalopram (LEXAPRO) 20 MG tablet    Sig: Take 1 tablet (20 mg total) by mouth daily.    Dispense:  90 tablet    Refill:  1    Follow-up: Return in about 6 months (around 06/03/2020) for medications/sleep.    Beatrice Lecher, MD

## 2019-12-02 NOTE — Assessment & Plan Note (Signed)
Doing well on trazodone. Refill sent to mail order pharmacy for 6 months worth. Plan to follow-up in 6 months.

## 2019-12-02 NOTE — Assessment & Plan Note (Signed)
Refilled his Tresa Garter which he takes chronically.  Gust with him that at some point he may want to have Dr. Oneal Grout take over this prescription.

## 2019-12-02 NOTE — Assessment & Plan Note (Signed)
He is happy with his current regimen with Lexapro and using trazodone for sleep.  We will send refills to the pharmacy.

## 2019-12-02 NOTE — Assessment & Plan Note (Signed)
Due to recheck lipids.  Will make sure refills are sent to pharmacy as well.

## 2019-12-04 ENCOUNTER — Encounter: Payer: Self-pay | Admitting: Family Medicine

## 2019-12-04 DIAGNOSIS — Z0000 Encounter for general adult medical examination without abnormal findings: Secondary | ICD-10-CM

## 2019-12-04 DIAGNOSIS — Z125 Encounter for screening for malignant neoplasm of prostate: Secondary | ICD-10-CM

## 2019-12-05 LAB — LIPID PANEL
Cholesterol: 135 mg/dL (ref <200)
HDL: 35 mg/dL — ABNORMAL LOW (ref >=40)
LDL Cholesterol (Calc): 79 mg/dL (calc)
Non-HDL Cholesterol (Calc): 100 mg/dL (calc) (ref <130)
Total CHOL/HDL Ratio: 3.9 (calc) (ref <5.0)
Triglycerides: 117 mg/dL (ref <150)

## 2019-12-05 LAB — COMPLETE METABOLIC PANEL WITH GFR
AG Ratio: 1.7 (calc) (ref 1.0–2.5)
ALT: 15 U/L (ref 9–46)
AST: 21 U/L (ref 10–35)
Albumin: 4.3 g/dL (ref 3.6–5.1)
Alkaline phosphatase (APISO): 66 U/L (ref 35–144)
BUN: 11 mg/dL (ref 7–25)
CO2: 31 mmol/L (ref 20–32)
Calcium: 9.1 mg/dL (ref 8.6–10.3)
Chloride: 102 mmol/L (ref 98–110)
Creat: 1.12 mg/dL (ref 0.70–1.25)
GFR, Est African American: 78 mL/min/{1.73_m2} (ref >=60)
GFR, Est Non African American: 68 mL/min/{1.73_m2} (ref >=60)
Globulin: 2.5 g/dL (calc) (ref 1.9–3.7)
Glucose, Bld: 81 mg/dL (ref 65–99)
Potassium: 4.2 mmol/L (ref 3.5–5.3)
Sodium: 138 mmol/L (ref 135–146)
Total Bilirubin: 0.3 mg/dL (ref 0.2–1.2)
Total Protein: 6.8 g/dL (ref 6.1–8.1)

## 2019-12-05 LAB — PSA: PSA: 1.6 ng/mL (ref <=4.0)

## 2019-12-11 ENCOUNTER — Telehealth: Payer: Self-pay | Admitting: *Deleted

## 2019-12-11 DIAGNOSIS — Z87891 Personal history of nicotine dependence: Secondary | ICD-10-CM

## 2019-12-11 NOTE — Telephone Encounter (Signed)
Spoke with pt and scheduled SDMV 01/01/20 11:00  Ct ordered Nothing further needed

## 2019-12-16 ENCOUNTER — Ambulatory Visit (HOSPITAL_COMMUNITY): Payer: Medicare Other

## 2019-12-18 ENCOUNTER — Ambulatory Visit (HOSPITAL_COMMUNITY)
Admission: RE | Admit: 2019-12-18 | Discharge: 2019-12-18 | Disposition: A | Discharge status: Home / Self Care | Payer: Medicare Other | Source: Ambulatory Visit | Attending: Family Medicine | Admitting: Family Medicine

## 2019-12-18 ENCOUNTER — Other Ambulatory Visit: Payer: Self-pay

## 2019-12-18 DIAGNOSIS — Z87891 Personal history of nicotine dependence: Secondary | ICD-10-CM | POA: Insufficient documentation

## 2020-01-01 ENCOUNTER — Ambulatory Visit (INDEPENDENT_AMBULATORY_CARE_PROVIDER_SITE_OTHER): Payer: Medicare Other | Admitting: Acute Care

## 2020-01-01 ENCOUNTER — Ambulatory Visit (INDEPENDENT_AMBULATORY_CARE_PROVIDER_SITE_OTHER): Payer: Medicare Other

## 2020-01-01 ENCOUNTER — Encounter: Payer: Self-pay | Admitting: Acute Care

## 2020-01-01 ENCOUNTER — Other Ambulatory Visit: Payer: Self-pay

## 2020-01-01 DIAGNOSIS — Z87891 Personal history of nicotine dependence: Secondary | ICD-10-CM

## 2020-01-01 NOTE — Progress Notes (Signed)
Shared Decision Making Visit Lung Cancer Screening Program [phone])   Eligibility:  Age 69 y.o.  Pack Years Smoking History Calculation 30 pack years (# packs/per year x # years smoked)  Recent History of coughing up blood  no  Unexplained weight loss? no ( >Than 15 pounds within the last 6 months )  Prior History Lung / other cancer no (Diagnosis within the last 5 years already requiring surveillance chest CT Scans).  Smoking Status Former Smoker  Former Smokers: Years since quit: 6 years  Quit Date: 2015  Visit Components:  Discussion included one or more decision making aids. yes  Discussion included risk/benefits of screening. yes  Discussion included potential follow up diagnostic testing for abnormal scans. yes  Discussion included meaning and risk of over diagnosis. yes  Discussion included meaning and risk of False Positives. yes  Discussion included meaning of total radiation exposure. yes  Counseling Included:  Importance of adherence to annual lung cancer LDCT screening. yes  Impact of comorbidities on ability to participate in the program. yes  Ability and willingness to under diagnostic treatment. yes  Smoking Cessation Counseling:  Current Smokers:   Discussed importance of smoking cessation. yes  Information about tobacco cessation classes and interventions provided to patient. yes  Patient provided with "ticket" for LDCT Scan. yes  Symptomatic Patient. no  Counseling NA  Diagnosis Code: Tobacco Use Z72.0  Asymptomatic Patient yes  Counseling (Intermediate counseling: > three minutes counseling) S3419  Former Smokers:   Discussed the importance of maintaining cigarette abstinence. yes  Diagnosis Code: Personal History of Nicotine Dependence. Q22.297  Information about tobacco cessation classes and interventions provided to patient. Yes  Patient provided with "ticket" for LDCT Scan. yes  Written Order for Lung Cancer Screening  with LDCT placed in Epic. Yes (CT Chest Lung Cancer Screening Low Dose W/O CM) [id number] Z12.2-Screening of respiratory organs Z87.891-Personal history of nicotine dependence  I spent 25 minutes of face to face time with Mr. Stofko discussing the risks and benefits of lung cancer screening. We viewed a power point together that explained in detail the above noted topics. We took the time to pause the power point at intervals to allow for questions to be asked and answered to ensure understanding. We discussed that he had taken the single most powerful action possible to decrease his risk of developing lung cancer when he quit smoking. I counseled him to remain smoke free, and to contact me if he ever had the desire to smoke again so that I can provide resources and tools to help support the effort to remain smoke free. We discussed the time and location of the scan, and that either  Abigail Miyamoto RN or I will call with the results within  24-48 hours of receiving them. He has my card and contact information in the event he needs to speak with me, in addition to a copy of the power point we reviewed as a resource. He verbalized understanding of all of the above and had no further questions upon leaving the office.     I explained to the patient that there has been a high incidence of coronary artery disease noted on these exams. I explained that this is a non-gated exam therefore degree or severity cannot be determined. This patient is noton statin therapy. I have asked the patient to follow-up with their PCP regarding any incidental finding of coronary artery disease and management with diet or medication as they feel is clinically indicated. The  patient verbalized understanding of the above and had no further questions.     Bevelyn Ngo, NP 01/01/2020 12:05 PM

## 2020-01-01 NOTE — Patient Instructions (Signed)
Thank you for participating in the [facility]. It was our pleasure to meet you today. We will call you with the results of your scan within the next few days. Your scan will be assigned a Lung RADS category score by the physicians reading the scans.  This Lung RADS score determines follow up scanning.  See below for description of categories, and follow up screening recommendations. We will be in touch to schedule your follow up screening annually or based on recommendations of our providers. We will fax a copy of your scan results to your Primary Care Physician, or the physician who referred you to the program, to ensure they have the results. Please call the office if you have any questions or concerns regarding your scanning experience or results.  Our office number is [phone]. Please speak with Denise Phelps, RN. She is our Lung Cancer Screening RN. If she is unavailable when you call, please have the office staff send her a message. She will return your call at her earliest convenience. Remember, if your scan is normal, we will scan you annually as long as you continue to meet the criteria for the program. (Age 55-77, Current smoker or smoker who has quit within the last 15 years). If you are a smoker, remember, quitting is the single most powerful action that you can take to decrease your risk of lung cancer and other pulmonary, breathing related problems. We know quitting is hard, and we are here to help.  Please let us know if there is anything we can do to help you meet your goal of quitting. If you are a former smoker, congratulations. We are proud of you! Remain smoke free! Remember you can refer friends or family members through the number above.  We will screen them to make sure they meet criteria for the program. Thank you for helping us take better care of you by participating in Lung Screening.  Lung RADS Categories:  Lung RADS 1: no nodules  or definitely non-concerning nodules.  Recommendation is for a repeat annual scan in 12 months.  Lung RADS 2:  nodules that are non-concerning in appearance and behavior with a very low likelihood of becoming an active cancer. Recommendation is for a repeat annual scan in 12 months.  Lung RADS 3: nodules that are probably non-concerning , includes nodules with a low likelihood of becoming an active cancer.  Recommendation is for a 6-month repeat screening scan. Often noted after an upper respiratory illness. We will be in touch to make sure you have no questions, and to schedule your 6-month scan.  Lung RADS 4 A: nodules with concerning findings, recommendation is most often for a follow up scan in 3 months or additional testing based on our provider's assessment of the scan. We will be in touch to make sure you have no questions and to schedule the recommended 3 month follow up scan.  Lung RADS 4 B:  indicates findings that are concerning. We will be in touch with you to schedule additional diagnostic testing based on our provider's  assessment of the scan.   

## 2020-01-02 NOTE — Progress Notes (Signed)
Please call patient and let them  know their  low dose Ct was read as a Lung RADS 1, negative study: no nodules or definitely benign nodules. Radiology recommendation is for a repeat LDCT in 12 months. .Please let them  know we will order and schedule their  annual screening scan for 12/2020. Please let them  know there was notation of CAD on their  scan.  Please remind the patient  that this is a non-gated exam therefore degree or severity of disease  cannot be determined. Please have them  follow up with their PCP regarding potential risk factor modification, dietary therapy or pharmacologic therapy if clinically indicated. Pt.  is  currently on statin therapy. Please place order for annual  screening scan for  12/2020 and fax results to PCP. Thanks so much. 

## 2020-01-06 ENCOUNTER — Other Ambulatory Visit: Payer: Self-pay | Admitting: *Deleted

## 2020-01-06 DIAGNOSIS — Z87891 Personal history of nicotine dependence: Secondary | ICD-10-CM

## 2020-01-07 ENCOUNTER — Telehealth: Payer: Self-pay | Admitting: Family Medicine

## 2020-01-07 ENCOUNTER — Encounter: Payer: Self-pay | Admitting: Family Medicine

## 2020-01-07 DIAGNOSIS — J432 Centrilobular emphysema: Secondary | ICD-10-CM

## 2020-01-07 DIAGNOSIS — I700 Atherosclerosis of aorta: Secondary | ICD-10-CM | POA: Insufficient documentation

## 2020-01-07 HISTORY — DX: Centrilobular emphysema: J43.2

## 2020-01-07 NOTE — Telephone Encounter (Signed)
Patient advised of recommendations. He states he will call back to schedule the spirometry.

## 2020-01-07 NOTE — Telephone Encounter (Signed)
Please call patient and let him know that I did receive a copy of his recent lung scan results.  He was noted to have some emphysematous changes on his scan I would like to schedule him for spirometry/breathing test here in our office at his convenience.

## 2020-01-08 ENCOUNTER — Other Ambulatory Visit: Payer: Self-pay | Admitting: Family Medicine

## 2020-01-17 ENCOUNTER — Other Ambulatory Visit: Payer: Self-pay

## 2020-01-17 MED ORDER — HYDROXYZINE HCL 25 MG PO TABS: 25.0000 mg | ORAL_TABLET | Freq: Two times a day (BID) | ORAL | 0 refills | Status: DC | PRN

## 2020-01-19 ENCOUNTER — Other Ambulatory Visit: Payer: Self-pay | Admitting: Family Medicine

## 2020-01-28 ENCOUNTER — Ambulatory Visit (INDEPENDENT_AMBULATORY_CARE_PROVIDER_SITE_OTHER): Payer: Medicare Other | Admitting: Family Medicine

## 2020-01-28 VITALS — BP 104/54 | HR 60 | Resp 16 | Ht 70.0 in | Wt 189.0 lb

## 2020-01-28 DIAGNOSIS — I959 Hypotension, unspecified: Secondary | ICD-10-CM

## 2020-01-28 DIAGNOSIS — J432 Centrilobular emphysema: Secondary | ICD-10-CM | POA: Diagnosis not present

## 2020-01-28 MED ORDER — HYDROXYZINE HCL 25 MG PO TABS: 25.0000 mg | ORAL_TABLET | Freq: Two times a day (BID) | ORAL | 1 refills | Status: DC | PRN

## 2020-01-28 NOTE — Assessment & Plan Note (Addendum)
Spirometry 01/28/2020: FVC of 82%, FEV1 of 89%, ratio of 80%: normal spirometry. Had 19% improvement in FEF 25-75 after albuterol.    Reviewed results today.  Overall he has pretty decent functioning even though his CT is showing some emphysematous changes.  Recommend that we monitor this at this point he feels completely asymptomatic.  Follow-up in 1 year for spirometry.  Did recommend yearly flu vaccine as well as avoiding triggers.  Monitor for any new onset symptoms including shortness of breath or chest pain.

## 2020-01-28 NOTE — Progress Notes (Signed)
Established Patient Office Visit  Subjective:  Patient ID: Paul Fowler, male    DOB: 1950-05-19  Age: 69 y.o. MRN: [id number]  CC: No chief complaint on file.   HPI Paul Fowler presents for from a tree.  He recently had a CT lung cancer screening.  He was noted to have emphysematous changes on the scan so I encouraged him to come in for spirometry so that we can better classify his emphysema.  He is a former smoker and quit in 2015.  He smoked a pack a day for most 30 years previously.  He is currently asymptomatic he has not noticed any shortness of breath or breathing problems with activities etc.  Past Medical History:  Diagnosis Date  . Arthritis   . Crohn's disease ([place])   . GERD (gastroesophageal reflux disease)    occasionally has indigestion  . Multiple sclerosis ([facility])    DX 3-4 YRS AGO-- ASYMPTOMATIC  . Neuromuscular disorder ([place])    MS  ON NO MEDS   . Ulcerative colitis ([place])    LAST FLAREUP SPRING 2014    Past Surgical History:  Procedure Laterality Date  . BACK SURGERY    . CERVICAL SPINE SURGERY     FUSION  . ELBOW SURGERY     LEFT  . MAXIMUM ACCESS (MAS)POSTERIOR LUMBAR INTERBODY FUSION (PLIF) 1 LEVEL N/A 07/11/2013   Procedure: FOR MAXIMUM ACCESS (MAS) POSTERIOR LUMBAR INTERBODY FUSION (PLIF) 1 LEVEL LUMBAR FOUR-FIVE;  Surgeon: Eustace Moore, MD;  Location: [facility];  Service: Neurosurgery;  Laterality: N/A;  . THUMB ARTHROSCOPY     LEFT  . TONSILLECTOMY    . TORN ROTATOR     LEFT SHOULDER    No family history on file.  Social History   Socioeconomic History  . Marital status: Divorced    Spouse name: Not on file  . Number of children: 2  . Years of education: 79  . Highest education level: Associate degree: academic program  Occupational History  . Occupation: customer support specialists    Comment: retired  Tobacco Use  . Smoking status: Former Smoker    Packs/day: 1.00    Years: 30.00    Pack years: 30.00    Quit date: 2015     Years since quitting: 6.7  . Smokeless tobacco: Never Used  Vaping Use  . Vaping Use: Every day  Substance and Sexual Activity  . Alcohol use: No  . Drug use: No  . Sexual activity: Not Currently  Other Topics Concern  . Not on file  Social History Narrative   Run errands   Watch TV   Social Determinants of Health   Financial Resource Strain: Low Risk   . Difficulty of Paying Living Expenses: Not hard at all  Food Insecurity: No Food Insecurity  . Worried About Charity fundraiser in the Last Year: Never true  . Ran Out of Food in the Last Year: Never true  Transportation Needs: No Transportation Needs  . Lack of Transportation (Medical): No  . Lack of Transportation (Non-Medical): No  Physical Activity: Inactive  . Days of Exercise per Week: 0 days  . Minutes of Exercise per Session: 0 min  Stress: No Stress Concern Present  . Feeling of Stress : Not at all  Social Connections: Moderately Isolated  . Frequency of Communication with Friends and Family: Three times a week  . Frequency of Social Gatherings with Friends and Family: Once a week  .  Attends Religious Services: More than 4 times per year  . Active Member of Clubs or Organizations: No  . Attends Archivist Meetings: Never  . Marital Status: Divorced  Human resources officer Violence: Not At Risk  . Fear of Current or Ex-Partner: No  . Emotionally Abused: No  . Physically Abused: No  . Sexually Abused: No    Outpatient Medications Prior to Visit  Medication Sig Dispense Refill  . Adalimumab 10 MG/0.2ML PSKT Inject into the skin every 7 (seven) days.    . ARIPiprazole (ABILIFY) 5 MG tablet TAKE 1 TABLET BY MOUTH EVERY DAY 90 tablet 3  . atorvastatin (LIPITOR) 10 MG tablet TAKE 1 TABLET BY MOUTH EVERY DAY 90 tablet 3  . b complex vitamins tablet Take 1 tablet by mouth daily.    Marland Kitchen buPROPion (WELLBUTRIN XL) 300 MG 24 hr tablet TAKE 1 TABLET BY MOUTH EVERY DAY 90 tablet 3  . carisoprodol (SOMA) 350 MG  tablet Take 1 tablet (350 mg total) by mouth 3 (three) times daily as needed. 90 tablet 5  . escitalopram (LEXAPRO) 20 MG tablet Take 1 tablet (20 mg total) by mouth daily. 90 tablet 1  . gabapentin (NEURONTIN) 800 MG tablet Take 1 tablet (800 mg total) by mouth 3 (three) times daily. 270 tablet 1  . HYDROmorphone (DILAUDID) 4 MG tablet Take 4 mg by mouth every 8 (eight) hours.    Marland Kitchen NARCAN 4 MG/0.1ML LIQD nasal spray kit     . traZODone (DESYREL) 100 MG tablet Take 2 tablets (200 mg total) by mouth at bedtime. 180 tablet 1  . hydrOXYzine (ATARAX/VISTARIL) 25 MG tablet Take 1 tablet (25 mg total) by mouth 2 (two) times daily as needed. 180 tablet 0   No facility-administered medications prior to visit.    Allergies  Allergen Reactions  . Buspirone Other (See Comments)    "wired up, out of body" "wired up, out of body"   . Diclofenac Other (See Comments)    Stomach upset Stomach upset   . Interferon Beta-1a Other (See Comments)    'bad reaction" 'bad reaction" 'bad reaction"   . Venlafaxine Other (See Comments)    "felt like a zombie" "felt like a zombie"   . Methocarbamol Other (See Comments)  . Saccharin Nausea Only    ROS Review of Systems    Objective:    Physical Exam Constitutional:      Appearance: He is well-developed.  HENT:     Head: Normocephalic and atraumatic.  Cardiovascular:     Rate and Rhythm: Normal rate and regular rhythm.     Heart sounds: Normal heart sounds.  Pulmonary:     Effort: Pulmonary effort is normal.     Breath sounds: Normal breath sounds.  Skin:    General: Skin is warm and dry.  Neurological:     Mental Status: He is alert and oriented to person, place, and time.  Psychiatric:        Behavior: Behavior normal.     BP (!) 104/54   Pulse 60   Resp 16   Ht $R'5\' 10"'TO$  (1.778 m)   Wt 189 lb (85.7 kg)   SpO2 95%   BMI 27.12 kg/m  Wt Readings from Last 3 Encounters:  01/28/20 189 lb (85.7 kg)  12/02/19 191 lb (86.6 kg)   05/30/19 187 lb (84.8 kg)     Health Maintenance Due  Topic Date Due  . Hepatitis C Screening  Never done  . INFLUENZA VACCINE  12/15/2019    There are no preventive care reminders to display for this patient.  No results found for: TSH Lab Results  Component Value Date   WBC 7.0 07/08/2013   HGB 14.9 07/08/2013   HCT 43.4 07/08/2013   MCV 92.1 07/08/2013   PLT 250 07/08/2013   Lab Results  Component Value Date   NA 138 12/04/2019   K 4.2 12/04/2019   CO2 31 12/04/2019   GLUCOSE 81 12/04/2019   BUN 11 12/04/2019   CREATININE 1.12 12/04/2019   BILITOT 0.3 12/04/2019   AST 21 12/04/2019   ALT 15 12/04/2019   PROT 6.8 12/04/2019   CALCIUM 9.1 12/04/2019   Lab Results  Component Value Date   CHOL 135 12/04/2019   Lab Results  Component Value Date   HDL 35 (L) 12/04/2019   Lab Results  Component Value Date   LDLCALC 79 12/04/2019   Lab Results  Component Value Date   TRIG 117 12/04/2019   Lab Results  Component Value Date   CHOLHDL 3.9 12/04/2019   No results found for: HGBA1C    Assessment & Plan:   Problem List Items Addressed This Visit      Respiratory   Centrilobular emphysema ([place]) - Primary    Spirometry 01/28/2020: FVC of 82%, FEV1 of 89%, ratio of 80%: normal spirometry. Had 19% improvement in FEF 25-75 after albuterol.    Reviewed results today.  Overall he has pretty decent functioning even though his CT is showing some emphysematous changes.  Recommend that we monitor this at this point he feels completely asymptomatic.  Follow-up in 1 year for spirometry.  Did recommend yearly flu vaccine as well as avoiding triggers.  Monitor for any new onset symptoms including shortness of breath or chest pain.       Other Visit Diagnoses    Hypotension, unspecified hypotension type         Spirometry today shows FVC of 82%, FEV1 of 89% with a ratio of 80% most consistent with normal spirometry interestingly he did have a 19% improvement in FEF  25-75 after albuterol.    Hypertension-blood pressure is definitely low today.  Repeat was a little bit better but in looking back at his blood pressures he tends to run low.  He says he drinks plenty of fluid and stays well-hydrated is not currently on blood pressure medications.  I think this is probably his baseline.  Meds ordered this encounter  Medications  . hydrOXYzine (ATARAX/VISTARIL) 25 MG tablet    Sig: Take 1 tablet (25 mg total) by mouth 2 (two) times daily as needed.    Dispense:  180 tablet    Refill:  1    Follow-up: Return in about 4 months (around 05/29/2020) for Mood and sleep.    Beatrice Lecher, MD

## 2020-02-03 ENCOUNTER — Ambulatory Visit (INDEPENDENT_AMBULATORY_CARE_PROVIDER_SITE_OTHER): Payer: Medicare Other | Admitting: Nurse Practitioner

## 2020-02-03 ENCOUNTER — Ambulatory Visit: Payer: Medicare Other

## 2020-02-03 ENCOUNTER — Other Ambulatory Visit: Payer: Self-pay

## 2020-02-03 ENCOUNTER — Encounter: Payer: Self-pay | Admitting: Nurse Practitioner

## 2020-02-03 VITALS — BP 117/78 | HR 57 | Temp 98.1°F | Ht 68.0 in | Wt 190.0 lb

## 2020-02-03 DIAGNOSIS — Z0000 Encounter for general adult medical examination without abnormal findings: Secondary | ICD-10-CM | POA: Diagnosis not present

## 2020-02-03 DIAGNOSIS — Z1159 Encounter for screening for other viral diseases: Secondary | ICD-10-CM

## 2020-02-03 NOTE — Patient Instructions (Addendum)
Health Maintenance, Male Adopting a healthy lifestyle and getting preventive care are important in promoting health and wellness. Ask your health care provider about:  The right schedule for you to have regular tests and exams.  Things you can do on your own to prevent diseases and keep yourself healthy. What should I know about diet, weight, and exercise? Eat a healthy diet   Eat a diet that includes plenty of vegetables, fruits, low-fat dairy products, and lean protein.  Do not eat a lot of foods that are high in solid fats, added sugars, or sodium. Maintain a healthy weight Body mass index (BMI) is a measurement that can be used to identify possible weight problems. It estimates body fat based on height and weight. Your health care provider can help determine your BMI and help you achieve or maintain a healthy weight. Get regular exercise Get regular exercise. This is one of the most important things you can do for your health. Most adults should:  Exercise for at least 150 minutes each week. The exercise should increase your heart rate and make you sweat (moderate-intensity exercise).  Do strengthening exercises at least twice a week. This is in addition to the moderate-intensity exercise.  Spend less time sitting. Even light physical activity can be beneficial. Watch cholesterol and blood lipids Have your blood tested for lipids and cholesterol at 68 years of age, then have this test every 5 years. You may need to have your cholesterol levels checked more often if:  Your lipid or cholesterol levels are high.  You are older than 68 years of age.  You are at high risk for heart disease. What should I know about cancer screening? Many types of cancers can be detected Paul Fowler and may often be prevented. Depending on your health history and family history, you may need to have cancer screening at various ages. This may include screening for:  Colorectal cancer.  Prostate  cancer.  Skin cancer.  Lung cancer. What should I know about heart disease, diabetes, and high blood pressure? Blood pressure and heart disease  High blood pressure causes heart disease and increases the risk of stroke. This is more likely to develop in people who have high blood pressure readings, are of African descent, or are overweight.  Talk with your health care provider about your target blood pressure readings.  Have your blood pressure checked: ? Every 3-5 years if you are 18-39 years of age. ? Every year if you are 40 years old or older.  If you are between the ages of 65 and 75 and are a current or former smoker, ask your health care provider if you should have a one-time screening for abdominal aortic aneurysm (AAA). Diabetes Have regular diabetes screenings. This checks your fasting blood sugar level. Have the screening done:  Once every three years after age 45 if you are at a normal weight and have a low risk for diabetes.  More often and at a younger age if you are overweight or have a high risk for diabetes. What should I know about preventing infection? Hepatitis B If you have a higher risk for hepatitis B, you should be screened for this virus. Talk with your health care provider to find out if you are at risk for hepatitis B infection. Hepatitis C Blood testing is recommended for:  Everyone born from 1945 through 1965.  Anyone with known risk factors for hepatitis C. Sexually transmitted infections (STIs)  You should be screened each year   for STIs, including gonorrhea and chlamydia, if: ? You are sexually active and are younger than 68 years of age. ? You are older than 68 years of age and your health care provider tells you that you are at risk for this type of infection. ? Your sexual activity has changed since you were last screened, and you are at increased risk for chlamydia or gonorrhea. Ask your health care provider if you are at risk.  Ask your  health care provider about whether you are at high risk for HIV. Your health care provider may recommend a prescription medicine to help prevent HIV infection. If you choose to take medicine to prevent HIV, you should first get tested for HIV. You should then be tested every 3 months for as long as you are taking the medicine. Follow these instructions at home: Lifestyle  Do not use any products that contain nicotine or tobacco, such as cigarettes, e-cigarettes, and chewing tobacco. If you need help quitting, ask your health care provider.  Do not use street drugs.  Do not share needles.  Ask your health care provider for help if you need support or information about quitting drugs. Alcohol use  Do not drink alcohol if your health care provider tells you not to drink.  If you drink alcohol: ? Limit how much you have to 0-2 drinks a day. ? Be aware of how much alcohol is in your drink. In the [place] one drink equals one 12 oz bottle of beer (355 mL), one 5 oz glass of wine (148 mL), or one 1 oz glass of hard liquor (44 mL). General instructions  Schedule regular health, dental, and eye exams.  Stay current with your vaccines.  Tell your health care provider if: ? You often feel depressed. ? You have ever been abused or do not feel safe at home. Summary  Adopting a healthy lifestyle and getting preventive care are important in promoting health and wellness.  Follow your health care provider's instructions about healthy diet, exercising, and getting tested or screened for diseases.  Follow your health care provider's instructions on monitoring your cholesterol and blood pressure. This information is not intended to replace advice given to you by your health care provider. Make sure you discuss any questions you have with your health care provider. Document Revised: 04/25/2018 Document Reviewed: 04/25/2018 Elsevier Patient Education  2020 [employer].   Fall Prevention in the  Home, Adult Falls can cause injuries. They can happen to people of all ages. There are many things you can do to make your home safe and to help prevent falls. Ask for help when making these changes, if needed. What actions can I take to prevent falls? General Instructions  Use good lighting in all rooms. Replace any light bulbs that burn out.  Turn on the lights when you go into a dark area. Use night-lights.  Keep items that you use often in easy-to-reach places. Lower the shelves around your home if necessary.  Set up your furniture so you have a clear path. Avoid moving your furniture around.  Do not have throw rugs and other things on the floor that can make you trip.  Avoid walking on wet floors.  If any of your floors are uneven, fix them.  Add color or contrast paint or tape to clearly mark and help you see: ? Any grab bars or handrails. ? First and last steps of stairways. ? Where the edge of each step is.  If  you use a stepladder: ? Make sure that it is fully opened. Do not climb a closed stepladder. ? Make sure that both sides of the stepladder are locked into place. ? Ask someone to hold the stepladder for you while you use it.  If there are any pets around you, be aware of where they are. What can I do in the bathroom?      Keep the floor dry. Clean up any water that spills onto the floor as soon as it happens.  Remove soap buildup in the tub or shower regularly.  Use non-skid mats or decals on the floor of the tub or shower.  Attach bath mats securely with double-sided, non-slip rug tape.  If you need to sit down in the shower, use a plastic, non-slip stool.  Install grab bars by the toilet and in the tub and shower. Do not use towel bars as grab bars. What can I do in the bedroom?  Make sure that you have a light by your bed that is easy to reach.  Do not use any sheets or blankets that are too big for your bed. They should not hang down onto the  floor.  Have a firm chair that has side arms. You can use this for support while you get dressed. What can I do in the kitchen?  Clean up any spills right away.  If you need to reach something above you, use a strong step stool that has a grab bar.  Keep electrical cords out of the way.  Do not use floor polish or wax that makes floors slippery. If you must use wax, use non-skid floor wax. What can I do with my stairs?  Do not leave any items on the stairs.  Make sure that you have a light switch at the top of the stairs and the bottom of the stairs. If you do not have them, ask someone to add them for you.  Make sure that there are handrails on both sides of the stairs, and use them. Fix handrails that are broken or loose. Make sure that handrails are as long as the stairways.  Install non-slip stair treads on all stairs in your home.  Avoid having throw rugs at the top or bottom of the stairs. If you do have throw rugs, attach them to the floor with carpet tape.  Choose a carpet that does not hide the edge of the steps on the stairway.  Check any carpeting to make sure that it is firmly attached to the stairs. Fix any carpet that is loose or worn. What can I do on the outside of my home?  Use bright outdoor lighting.  Regularly fix the edges of walkways and driveways and fix any cracks.  Remove anything that might make you trip as you walk through a door, such as a raised step or threshold.  Trim any bushes or trees on the path to your home.  Regularly check to see if handrails are loose or broken. Make sure that both sides of any steps have handrails.  Install guardrails along the edges of any raised decks and porches.  Clear walking paths of anything that might make someone trip, such as tools or rocks.  Have any leaves, snow, or ice cleared regularly.  Use sand or salt on walking paths during winter.  Clean up any spills in your garage right away. This includes  grease or oil spills. What other actions can I take?  Wear  shoes that: ? Have a low heel. Do not wear high heels. ? Have rubber bottoms. ? Are comfortable and fit you well. ? Are closed at the toe. Do not wear open-toe sandals.  Use tools that help you move around (mobility aids) if they are needed. These include: ? Canes. ? Walkers. ? Scooters. ? Crutches.  Review your medicines with your doctor. Some medicines can make you feel dizzy. This can increase your chance of falling. Ask your doctor what other things you can do to help prevent falls. Where to find more information  Centers for Disease Control and Prevention, [facility]: HealthcareCounselor.com.pt  [employer] on Aging: RingConnections.si Contact a doctor if:  You are afraid of falling at home.  You feel weak, drowsy, or dizzy at home.  You fall at home. Summary  There are many simple things that you can do to make your home safe and to help prevent falls.  Ways to make your home safe include removing tripping hazards and installing grab bars in the bathroom.  Ask for help when making these changes in your home. This information is not intended to replace advice given to you by your health care provider. Make sure you discuss any questions you have with your health care provider. Document Revised: 08/23/2018 Document Reviewed: 12/15/2016 Elsevier Patient Education  2020 [employer].

## 2020-02-03 NOTE — Progress Notes (Signed)
Subjective:   Paul Fowler is a 69 y.o. male who presents for Medicare Annual/Subsequent preventive examination.  Review of Systems    Denies chest pain, shortness of breath, or dizziness Denies changes in bowel or bladder habits Denies any new headaches or vision changes Denies any new musculoskeletal changes or pain  Cardiac Risk Factors include: advanced age (>34men, >52 women)     Objective:    Today's Vitals   02/03/20 1319  BP: 117/78  Pulse: (!) 57  Temp: 98.1 F (36.7 C)  TempSrc: Oral  SpO2: 95%  Weight: 190 lb (86.2 kg)  Height: $Remove'5\' 8"'ovsoZWm$  (1.727 m)   Body mass index is 28.89 kg/m.  Advanced Directives 02/03/2020 01/28/2019 09/30/2018 05/02/2017 07/11/2013 07/08/2013  Does Patient Have a Medical Advance Directive? Yes Yes Yes Yes Patient has advance directive, copy not in chart Patient has advance directive, copy not in chart  Type of Advance Directive Living will [place];Living will Living will [place];Living will Living will -  Does patient want to make changes to medical advance directive? No - Patient declined No - Patient declined No - Patient declined - No change requested -  Copy of [place] in Chart? - No - copy requested - No - copy requested - -    Current Medications (verified) Outpatient Encounter Medications as of 02/03/2020  Medication Sig  . Adalimumab 10 MG/0.2ML PSKT Inject into the skin every 7 (seven) days.  . ARIPiprazole (ABILIFY) 5 MG tablet TAKE 1 TABLET BY MOUTH EVERY DAY  . atorvastatin (LIPITOR) 10 MG tablet TAKE 1 TABLET BY MOUTH EVERY DAY  . b complex vitamins tablet Take 1 tablet by mouth daily.  Marland Kitchen buPROPion (WELLBUTRIN XL) 300 MG 24 hr tablet TAKE 1 TABLET BY MOUTH EVERY DAY  . carisoprodol (SOMA) 350 MG tablet Take 1 tablet (350 mg total) by mouth 3 (three) times daily as needed.  Marland Kitchen escitalopram (LEXAPRO) 20 MG tablet Take 1 tablet (20 mg total) by mouth daily.  Marland Kitchen  gabapentin (NEURONTIN) 800 MG tablet Take 1 tablet (800 mg total) by mouth 3 (three) times daily.  Marland Kitchen HYDROmorphone (DILAUDID) 4 MG tablet Take 4 mg by mouth every 8 (eight) hours.  . hydrOXYzine (ATARAX/VISTARIL) 25 MG tablet Take 1 tablet (25 mg total) by mouth 2 (two) times daily as needed.  Marland Kitchen NARCAN 4 MG/0.1ML LIQD nasal spray kit   . traZODone (DESYREL) 100 MG tablet Take 2 tablets (200 mg total) by mouth at bedtime.   No facility-administered encounter medications on file as of 02/03/2020.    Allergies (verified) Buspirone, Diclofenac, Interferon beta-1a, Venlafaxine, Methocarbamol, and Saccharin   History: Past Medical History:  Diagnosis Date  . Arthritis   . Crohn's disease ([place])   . GERD (gastroesophageal reflux disease)    occasionally has indigestion  . Multiple sclerosis ([facility])    DX 3-4 YRS AGO-- ASYMPTOMATIC  . Neuromuscular disorder ([place])    MS  ON NO MEDS   . Ulcerative colitis ([place])    LAST FLAREUP SPRING 2014   Past Surgical History:  Procedure Laterality Date  . BACK SURGERY    . CERVICAL SPINE SURGERY     FUSION  . ELBOW SURGERY     LEFT  . MAXIMUM ACCESS (MAS)POSTERIOR LUMBAR INTERBODY FUSION (PLIF) 1 LEVEL N/A 07/11/2013   Procedure: FOR MAXIMUM ACCESS (MAS) POSTERIOR LUMBAR INTERBODY FUSION (PLIF) 1 LEVEL LUMBAR FOUR-FIVE;  Surgeon: Eustace Moore, MD;  Location[facility]  NEURO ORS;  Service: Neurosurgery;  Laterality: N/A;  . THUMB ARTHROSCOPY     LEFT  . TONSILLECTOMY    . TORN ROTATOR     LEFT SHOULDER   History reviewed. No pertinent family history. Social History   Socioeconomic History  . Marital status: Divorced    Spouse name: Not on file  . Number of children: 2  . Years of education: 82  . Highest education level: Associate degree: academic program  Occupational History  . Occupation: customer support specialists    Comment: retired  Tobacco Use  . Smoking status: Former Smoker    Packs/day: 1.00    Years: 30.00    Pack years: 30.00     Types: Cigarettes    Quit date: 2015    Years since quitting: 6.7  . Smokeless tobacco: Never Used  . Tobacco comment: Quit  Vaping Use  . Vaping Use: Every day  . Substances: Nicotine, Flavoring  Substance and Sexual Activity  . Alcohol use: No  . Drug use: No  . Sexual activity: Not Currently  Other Topics Concern  . Not on file  Social History Narrative   Run errands   Watch TV   Social Determinants of Health   Financial Resource Strain: Low Risk   . Difficulty of Paying Living Expenses: Not hard at all  Food Insecurity: No Food Insecurity  . Worried About Charity fundraiser in the Last Year: Never true  . Ran Out of Food in the Last Year: Never true  Transportation Needs: No Transportation Needs  . Lack of Transportation (Medical): No  . Lack of Transportation (Non-Medical): No  Physical Activity: Insufficiently Active  . Days of Exercise per Week: 4 days  . Minutes of Exercise per Session: 20 min  Stress: No Stress Concern Present  . Feeling of Stress : Not at all  Social Connections: Moderately Integrated  . Frequency of Communication with Friends and Family: More than three times a week  . Frequency of Social Gatherings with Friends and Family: More than three times a week  . Attends Religious Services: More than 4 times per year  . Active Member of Clubs or Organizations: Yes  . Attends Archivist Meetings: More than 4 times per year  . Marital Status: Divorced    Tobacco Counseling Counseling given: No Comment: Quit   Clinical Intake:  Pre-visit preparation completed: Yes  Pain : No/denies pain     BMI - recorded: 28.89 Nutritional Status: BMI 25 -29 Overweight Nutritional Risks: None Diabetes: No  How often do you need to have someone help you when you read instructions, pamphlets, or other written materials from your doctor or pharmacy?: 1 - Never What is the last grade level you completed in school?: Associates degree  Diabetic?  No  Interpreter Needed?: No  Information entered by :: [place],CMA   Activities of Daily Living In your present state of health, do you have any difficulty performing the following activities: 02/03/2020  Hearing? N  Vision? N  Difficulty concentrating or making decisions? N  Walking or climbing stairs? N  Dressing or bathing? N  Doing errands, shopping? N  Preparing Food and eating ? N  Using the Toilet? N  In the past six months, have you accidently leaked urine? N  Do you have problems with loss of bowel control? N  Managing your Medications? N  Managing your Finances? N  Housekeeping or managing your Housekeeping? N  Some recent data might be  hidden    Patient Care Team: Hali Marry, MD as PCP - General (Family Medicine)  Indicate any recent Medical Services you may have received from other than [facility] providers in the past year (date may be approximate). GI- Darlis Loan, MD [facility])    Assessment:   This is a routine wellness examination for [place].  Hearing/Vision screen No exam data present  Dietary issues and exercise activities discussed: Current Exercise Habits: Home exercise routine, Type of exercise: walking, Time (Minutes): 15, Frequency (Times/Week): 2, Weekly Exercise (Minutes/Week): 30, Intensity: Mild  Goals    .  Exercise 3x per week (30 min per time)      Exercise at least 3 times a week for 30 minutes at a time    .  Walk for longer period of time and more often (pt-stated)      Depression Screen PHQ 2/9 Scores 02/03/2020 12/02/2019 05/30/2019 01/28/2019 09/30/2018  PHQ - 2 Score 0 0 0 0 0    Fall Risk Fall Risk  02/03/2020 12/02/2019 01/28/2019  Falls in the past year? 0 0 0  Number falls in past yr: 0 - 0  Injury with Fall? 0 - 0  Risk for fall due to : - No Fall Risks -  Follow up Falls evaluation completed - Falls prevention discussed    Any stairs in or around the home? Yes  If so, are there any without handrails? No  Home free  of loose throw rugs in walkways, pet beds, electrical cords, etc? No  Adequate lighting in your home to reduce risk of falls? Yes   ASSISTIVE DEVICES UTILIZED TO PREVENT FALLS:  Life alert? No  Use of a cane, walker or w/c? No  Grab bars in the bathroom? Yes  Shower chair or bench in shower? No  Elevated toilet seat or a handicapped toilet? No   TIMED UP AND GO:  Was the test performed? Yes .  Length of time to ambulate 10 feet: 2 sec.   Gait steady and fast without use of assistive device  Cognitive Function:     6CIT Screen 02/03/2020 01/28/2019  What Year? 0 points 0 points  What month? 0 points 0 points  What time? 0 points 0 points  Count back from 20 0 points 0 points  Months in reverse 0 points 0 points  Repeat phrase 0 points 0 points  Total Score 0 0    Immunizations Immunization History  Administered Date(s) Administered  . Fluad Quad(high Dose 65+) 01/18/2019  . Influenza, High Dose Seasonal PF 02/07/2018  . Moderna SARS-COVID-2 Vaccination 08/04/2019, 09/04/2019  . PPD Test 02/13/2015  . Pneumococcal Conjugate-13 10/17/2019  . Td 06/27/2007  . Tdap 05/20/2006, 09/06/2016  . Zoster Recombinat (Shingrix) 10/17/2019    TDAP status: Up to date Flu Vaccine status: Up to date Pneumococcal vaccine status: Up to date Covid-19 vaccine status: Completed vaccines  Qualifies for Shingles Vaccine? completed  Zostavax completed No   Shingrix Completed?: Yes  Screening Tests Health Maintenance  Topic Date Due  . Hepatitis C Screening  Never done  . INFLUENZA VACCINE  08/13/2020 (Originally 12/15/2019)  . PNA vac Low Risk Adult (2 of 2 - PPSV23) 10/16/2020  . TETANUS/TDAP  09/07/2026  . COLONOSCOPY  04/09/2028  . COVID-19 Vaccine  Completed    Health Maintenance  Health Maintenance Due  Topic Date Due  . Hepatitis C Screening  Never done    Colorectal cancer screening: Completed 2019. Repeat every 2 years- has  appointment scheduled for follow-up  Lung  Cancer Screening: (Low Dose CT Chest recommended if Age 93-80 years, 30 pack-year currently smoking OR have quit w/in 15years.) does qualify.   Lung Cancer Screening Referral: This was completed 01/01/2020  Additional Screening:  Hepatitis C Screening: does qualify; Completed ordered today  Vision Screening: Recommended annual ophthalmology exams for [facility] detection of glaucoma and other disorders of the eye. Is the patient up to date with their annual eye exam?  Yes  Who is the provider or what is the name of the office in which the patient attends annual eye exams? [facility] If pt is not established with a provider, would they like to be referred to a provider to establish care? established with Dr. Madilyn Fireman.   Dental Screening: Recommended annual dental exams for proper oral hygiene- regular dental exams performed- Dr. Dellis Anes office in [facility] Referral / Chronic Care Management: CRR required this visit?  No   CCM required this visit?  No      Plan:     I have personally reviewed and noted the following in the patient's chart:   . Medical and social history . Use of alcohol, tobacco or illicit drugs  . Current medications and supplements . Functional ability and status . Nutritional status . Physical activity . Advanced directives . List of other physicians . Hospitalizations, surgeries, and ER visits in previous 12 months . Vitals . Screenings to include cognitive, depression, and falls . Referrals and appointments  In addition, I have reviewed and discussed with patient certain preventive protocols, quality metrics, and best practice recommendations. A written personalized care plan for preventive services as well as general preventive health recommendations were provided to patient.     Orma Render, NP   02/03/2020   Nurse Notes: reviewed

## 2020-02-07 NOTE — Addendum Note (Signed)
Addended by: Chalmers Cater on: 02/07/2020 10:35 AM   Modules accepted: Orders

## 2020-03-20 ENCOUNTER — Other Ambulatory Visit: Payer: Self-pay | Admitting: *Deleted

## 2020-03-20 DIAGNOSIS — F329 Major depressive disorder, single episode, unspecified: Secondary | ICD-10-CM

## 2020-03-20 MED ORDER — ARIPIPRAZOLE 5 MG PO TABS: 5.0000 mg | ORAL_TABLET | Freq: Every day | ORAL | 3 refills | Status: DC

## 2020-03-20 MED ORDER — BUPROPION HCL ER (XL) 300 MG PO TB24
300.0000 mg | ORAL_TABLET | Freq: Every day | ORAL | 3 refills | Status: DC
Start: 2020-03-20 — End: 2021-01-25

## 2020-04-08 ENCOUNTER — Other Ambulatory Visit: Payer: Self-pay | Admitting: Family Medicine

## 2020-04-08 DIAGNOSIS — G4700 Insomnia, unspecified: Secondary | ICD-10-CM

## 2020-05-18 ENCOUNTER — Other Ambulatory Visit: Payer: Self-pay | Admitting: *Deleted

## 2020-05-18 ENCOUNTER — Other Ambulatory Visit: Payer: Self-pay | Admitting: Family Medicine

## 2020-05-18 DIAGNOSIS — G8929 Other chronic pain: Secondary | ICD-10-CM

## 2020-05-18 DIAGNOSIS — M4802 Spinal stenosis, cervical region: Secondary | ICD-10-CM

## 2020-05-18 DIAGNOSIS — M545 Low back pain, unspecified: Secondary | ICD-10-CM

## 2020-05-18 DIAGNOSIS — Z981 Arthrodesis status: Secondary | ICD-10-CM

## 2020-05-18 MED ORDER — ATORVASTATIN CALCIUM 10 MG PO TABS
10.0000 mg | ORAL_TABLET | Freq: Every day | ORAL | 3 refills | Status: DC
Start: 2020-05-18 — End: 2021-01-22

## 2020-06-03 ENCOUNTER — Ambulatory Visit: Payer: Medicare Other | Admitting: Family Medicine

## 2020-06-03 ENCOUNTER — Other Ambulatory Visit: Payer: Self-pay

## 2020-06-03 ENCOUNTER — Ambulatory Visit (INDEPENDENT_AMBULATORY_CARE_PROVIDER_SITE_OTHER): Payer: Medicare Other | Admitting: Family Medicine

## 2020-06-03 ENCOUNTER — Encounter: Payer: Self-pay | Admitting: Family Medicine

## 2020-06-03 VITALS — BP 108/50 | HR 62 | Ht 68.0 in | Wt 201.0 lb

## 2020-06-03 DIAGNOSIS — M545 Low back pain, unspecified: Secondary | ICD-10-CM

## 2020-06-03 DIAGNOSIS — G8929 Other chronic pain: Secondary | ICD-10-CM

## 2020-06-03 DIAGNOSIS — F325 Major depressive disorder, single episode, in full remission: Secondary | ICD-10-CM | POA: Diagnosis not present

## 2020-06-03 DIAGNOSIS — Z981 Arthrodesis status: Secondary | ICD-10-CM | POA: Diagnosis not present

## 2020-06-03 DIAGNOSIS — M542 Cervicalgia: Secondary | ICD-10-CM

## 2020-06-03 DIAGNOSIS — M4802 Spinal stenosis, cervical region: Secondary | ICD-10-CM | POA: Diagnosis not present

## 2020-06-03 DIAGNOSIS — G4700 Insomnia, unspecified: Secondary | ICD-10-CM

## 2020-06-03 DIAGNOSIS — Z6830 Body mass index (BMI) 30.0-30.9, adult: Secondary | ICD-10-CM

## 2020-06-03 MED ORDER — CARISOPRODOL 350 MG PO TABS: 350.0000 mg | ORAL_TABLET | Freq: Three times a day (TID) | ORAL | 5 refills | Status: DC | PRN

## 2020-06-03 NOTE — Progress Notes (Signed)
Established Patient Office Visit  Subjective:  Patient ID: Paul Fowler, male    DOB: Sep 16, 1950  Age: 70 y.o. MRN: [id number]  CC:  Chief Complaint  Patient presents with  . Insomnia  . MOOD    HPI Paul Fowler presents for   F/U MDD -history of anxiety/depression.  He is currently on Abilify, Lexapro, and Wellbutrin and feels like his regimen is working really well.  For like we need to make any changes today.  Insomnia-currently taking 2 tabs of trazodone at bedtime he feels like this is effective and does not cause any daytime sedation or excess sleepiness.  He has gained some weight BMI is now 30 he is considering Nutrisystem but just wanted to discuss whether or not that might be a good option for him.  Past Medical History:  Diagnosis Date  . Arthritis   . Crohn's disease ([place])   . GERD (gastroesophageal reflux disease)    occasionally has indigestion  . Multiple sclerosis ([facility])    DX 3-4 YRS AGO-- ASYMPTOMATIC  . Neuromuscular disorder ([place])    MS  ON NO MEDS   . Ulcerative colitis ([place])    LAST FLAREUP SPRING 2014    Past Surgical History:  Procedure Laterality Date  . BACK SURGERY    . CERVICAL SPINE SURGERY     FUSION  . ELBOW SURGERY     LEFT  . MAXIMUM ACCESS (MAS)POSTERIOR LUMBAR INTERBODY FUSION (PLIF) 1 LEVEL N/A 07/11/2013   Procedure: FOR MAXIMUM ACCESS (MAS) POSTERIOR LUMBAR INTERBODY FUSION (PLIF) 1 LEVEL LUMBAR FOUR-FIVE;  Surgeon: Eustace Moore, MD;  Location: [facility];  Service: Neurosurgery;  Laterality: N/A;  . THUMB ARTHROSCOPY     LEFT  . TONSILLECTOMY    . TORN ROTATOR     LEFT SHOULDER    No family history on file.  Social History   Socioeconomic History  . Marital status: Divorced    Spouse name: Not on file  . Number of children: 2  . Years of education: 16  . Highest education level: Associate degree: academic program  Occupational History  . Occupation: customer support specialists    Comment: retired  Tobacco  Use  . Smoking status: Former Smoker    Packs/day: 1.00    Years: 30.00    Pack years: 30.00    Types: Cigarettes    Quit date: 2015    Years since quitting: 7.0  . Smokeless tobacco: Never Used  . Tobacco comment: Quit  Vaping Use  . Vaping Use: Every day  . Substances: Nicotine, Flavoring  Substance and Sexual Activity  . Alcohol use: No  . Drug use: No  . Sexual activity: Not Currently  Other Topics Concern  . Not on file  Social History Narrative   Run errands   Watch TV   Social Determinants of Health   Financial Resource Strain: Low Risk   . Difficulty of Paying Living Expenses: Not hard at all  Food Insecurity: No Food Insecurity  . Worried About Charity fundraiser in the Last Year: Never true  . Ran Out of Food in the Last Year: Never true  Transportation Needs: No Transportation Needs  . Lack of Transportation (Medical): No  . Lack of Transportation (Non-Medical): No  Physical Activity: Insufficiently Active  . Days of Exercise per Week: 4 days  . Minutes of Exercise per Session: 20 min  Stress: No Stress Concern Present  . Feeling of Stress : Not at  all  Social Connections: Moderately Integrated  . Frequency of Communication with Friends and Family: More than three times a week  . Frequency of Social Gatherings with Friends and Family: More than three times a week  . Attends Religious Services: More than 4 times per year  . Active Member of Clubs or Organizations: Yes  . Attends Archivist Meetings: More than 4 times per year  . Marital Status: Divorced  Human resources officer Violence: Not At Risk  . Fear of Current or Ex-Partner: No  . Emotionally Abused: No  . Physically Abused: No  . Sexually Abused: No    Outpatient Medications Prior to Visit  Medication Sig Dispense Refill  . Adalimumab 40 MG/0.4ML PSKT Inject 0.4 mLs into the skin every 7 (seven) days.    . ARIPiprazole (ABILIFY) 5 MG tablet Take 1 tablet (5 mg total) by mouth daily. 90  tablet 3  . atorvastatin (LIPITOR) 10 MG tablet Take 1 tablet (10 mg total) by mouth daily. 90 tablet 3  . b complex vitamins tablet Take 1 tablet by mouth daily.    Marland Kitchen buPROPion (WELLBUTRIN XL) 300 MG 24 hr tablet Take 1 tablet (300 mg total) by mouth daily. 90 tablet 3  . escitalopram (LEXAPRO) 20 MG tablet Take 1 tablet (20 mg total) by mouth daily. 90 tablet 1  . gabapentin (NEURONTIN) 800 MG tablet TAKE 1 TABLET THREE TIMES DAILY 270 tablet 1  . HYDROmorphone (DILAUDID) 4 MG tablet Take 4 mg by mouth every 8 (eight) hours.    . hydrOXYzine (ATARAX/VISTARIL) 25 MG tablet Take 1 tablet (25 mg total) by mouth 2 (two) times daily as needed. 180 tablet 1  . NARCAN 4 MG/0.1ML LIQD nasal spray kit     . traZODone (DESYREL) 100 MG tablet TAKE 2 TABLETS AT BEDTIME 180 tablet 1  . carisoprodol (SOMA) 350 MG tablet TAKE 1 TABLET (350 MG TOTAL) BY MOUTH 3 (THREE) TIMES DAILY AS NEEDED. 90 tablet 0  . Adalimumab 10 MG/0.2ML PSKT Inject into the skin every 7 (seven) days.     No facility-administered medications prior to visit.    Allergies  Allergen Reactions  . Buspirone Other (See Comments)    "wired up, out of body" "wired up, out of body"   . Diclofenac Other (See Comments)    Stomach upset Stomach upset   . Interferon Beta-1a Other (See Comments)    'bad reaction" 'bad reaction" 'bad reaction"   . Venlafaxine Other (See Comments)    "felt like a zombie" "felt like a zombie"   . Methocarbamol Other (See Comments)  . Saccharin Nausea Only    ROS Review of Systems    Objective:    Physical Exam Constitutional:      Appearance: He is well-developed and well-nourished.  HENT:     Head: Normocephalic and atraumatic.  Cardiovascular:     Rate and Rhythm: Normal rate and regular rhythm.     Heart sounds: Normal heart sounds.  Pulmonary:     Effort: Pulmonary effort is normal.     Breath sounds: Normal breath sounds.  Skin:    General: Skin is warm and dry.  Neurological:      Mental Status: He is alert and oriented to person, place, and time.  Psychiatric:        Mood and Affect: Mood and affect normal.        Behavior: Behavior normal.     BP (!) 108/50   Pulse 62  Ht '5\' 8"'$  (1.727 m)   Wt 201 lb (91.2 kg)   SpO2 95%   BMI 30.56 kg/m  Wt Readings from Last 3 Encounters:  06/03/20 201 lb (91.2 kg)  02/03/20 190 lb (86.2 kg)  01/28/20 189 lb (85.7 kg)     Health Maintenance Due  Topic Date Due  . Hepatitis C Screening  Never done    There are no preventive care reminders to display for this patient.  No results found for: TSH Lab Results  Component Value Date   WBC 7.0 07/08/2013   HGB 14.9 07/08/2013   HCT 43.4 07/08/2013   MCV 92.1 07/08/2013   PLT 250 07/08/2013   Lab Results  Component Value Date   NA 138 12/04/2019   K 4.2 12/04/2019   CO2 31 12/04/2019   GLUCOSE 81 12/04/2019   BUN 11 12/04/2019   CREATININE 1.12 12/04/2019   BILITOT 0.3 12/04/2019   AST 21 12/04/2019   ALT 15 12/04/2019   PROT 6.8 12/04/2019   CALCIUM 9.1 12/04/2019   Lab Results  Component Value Date   CHOL 135 12/04/2019   Lab Results  Component Value Date   HDL 35 (L) 12/04/2019   Lab Results  Component Value Date   LDLCALC 79 12/04/2019   Lab Results  Component Value Date   TRIG 117 12/04/2019   Lab Results  Component Value Date   CHOLHDL 3.9 12/04/2019   No results found for: HGBA1C    Assessment & Plan:   Problem List Items Addressed This Visit      Musculoskeletal and Integument   Foraminal stenosis of cervical region   Relevant Medications   carisoprodol (SOMA) 350 MG tablet     Other   S/P lumbar spinal fusion   Relevant Medications   carisoprodol (SOMA) 350 MG tablet   Insomnia    Doing well on the trazodone.  Will refill medications.        Depression - Primary    See note for anxiety above.      Chronic neck pain    Follows with pain management for his chronic back pain but we do write his Soma.   Normally we write refills on his medication but it was time for him to come in.  He does follow with Dr. Selinda Orion, his pain specialist he has not had any recent changes to his medication regimen or injections.  He mostly uses the [facility] for his chronic neck and shoulder pain.      Relevant Medications   Adalimumab 40 MG/0.4ML PSKT   carisoprodol (SOMA) 350 MG tablet   Chronic bilateral low back pain   Relevant Medications   Adalimumab 40 MG/0.4ML PSKT   carisoprodol (SOMA) 350 MG tablet    Other Visit Diagnoses    BMI 30.0-30.9,adult          BMI 30-discussed options discussed some strategies around really looking at calorie counting and eating low-carb and low sugar diet.  He can use an app such as my fitness pal or lose it.  But I also think some of the other programs such as Nutrisystem or weight watchers can also be good and that they help also limit calories and portion control.  Also encouraged regular exercise.  Meds ordered this encounter  Medications  . carisoprodol (SOMA) 350 MG tablet    Sig: Take 1 tablet (350 mg total) by mouth 3 (three) times daily as needed.    Dispense:  90 tablet  Refill:  5    This request is for a new prescription for a controlled substance as required by Federal/State law. DX Code Needed  .    Follow-up: Return in about 6 months (around 12/01/2020) for Mood medication and muscle relaxer.    Beatrice Lecher, MD

## 2020-06-03 NOTE — Progress Notes (Signed)
Pt reports that he wanted to discuss his Soma prescription with Dr. Linford Arnold. He said that he would like to have more refills on the medication. He said that there weren't any on this refill.

## 2020-06-03 NOTE — Assessment & Plan Note (Signed)
Doing well on the trazodone.  Will refill medications.

## 2020-06-04 ENCOUNTER — Encounter: Payer: Self-pay | Admitting: Family Medicine

## 2020-06-04 DIAGNOSIS — G8929 Other chronic pain: Secondary | ICD-10-CM

## 2020-06-04 DIAGNOSIS — Z981 Arthrodesis status: Secondary | ICD-10-CM

## 2020-06-04 DIAGNOSIS — M4802 Spinal stenosis, cervical region: Secondary | ICD-10-CM

## 2020-06-04 NOTE — Assessment & Plan Note (Signed)
Follows with pain management for his chronic back pain but we do write his Soma.  Normally we write refills on his medication but it was time for him to come in.  He does follow with Dr. Oneal Grout, his pain specialist he has not had any recent changes to his medication regimen or injections.  He mostly uses the [facility] for his chronic neck and shoulder pain.

## 2020-06-04 NOTE — Assessment & Plan Note (Signed)
See note for anxiety above.

## 2020-06-05 MED ORDER — CARISOPRODOL 350 MG PO TABS: 350.0000 mg | ORAL_TABLET | Freq: Three times a day (TID) | ORAL | 1 refills | Status: DC | PRN

## 2020-06-18 ENCOUNTER — Ambulatory Visit (INDEPENDENT_AMBULATORY_CARE_PROVIDER_SITE_OTHER): Payer: Medicare Other | Admitting: Family Medicine

## 2020-06-18 ENCOUNTER — Encounter: Payer: Self-pay | Admitting: Family Medicine

## 2020-06-18 ENCOUNTER — Other Ambulatory Visit: Payer: Self-pay

## 2020-06-18 VITALS — BP 119/63 | HR 80 | Ht 68.0 in | Wt 200.0 lb

## 2020-06-18 DIAGNOSIS — L600 Ingrowing nail: Secondary | ICD-10-CM

## 2020-06-18 NOTE — Progress Notes (Signed)
Acute Office Visit  Subjective:    Patient ID: Paul Fowler, male    DOB: 1951-05-12, 70 y.o.   MRN: [id number]  Chief Complaint  Patient presents with  . Ingrown Toenail      HPI Patient is in today for ingrown toenail.  He says it is bothering him for a couple of months at this point he really just did not do anything about it when he was here the other day.  He tried to take care of it on his own but tried to clip part of the nail but it still just been painful and tender it has not been draining any pus etc.  Past Medical History:  Diagnosis Date  . Arthritis   . Crohn's disease ([facility])   . GERD (gastroesophageal reflux disease)    occasionally has indigestion  . Multiple sclerosis ([facility])    DX 3-4 YRS AGO-- ASYMPTOMATIC  . Neuromuscular disorder ([facility])    MS  ON NO MEDS   . Ulcerative colitis ([facility])    LAST FLAREUP SPRING 2014    Past Surgical History:  Procedure Laterality Date  . BACK SURGERY    . CERVICAL SPINE SURGERY     FUSION  . ELBOW SURGERY     LEFT  . MAXIMUM ACCESS (MAS)POSTERIOR LUMBAR INTERBODY FUSION (PLIF) 1 LEVEL N/A 07/11/2013   Procedure: FOR MAXIMUM ACCESS (MAS) POSTERIOR LUMBAR INTERBODY FUSION (PLIF) 1 LEVEL LUMBAR FOUR-FIVE;  Surgeon: Tia Alert, MD;  Location: [facility];  Service: Neurosurgery;  Laterality: N/A;  . THUMB ARTHROSCOPY     LEFT  . TONSILLECTOMY    . TORN ROTATOR     LEFT SHOULDER    History reviewed. No pertinent family history.  Social History   Socioeconomic History  . Marital status: Divorced    Spouse name: Not on file  . Number of children: 2  . Years of education: 79  . Highest education level: Associate degree: academic program  Occupational History  . Occupation: customer support specialists    Comment: retired  Tobacco Use  . Smoking status: Former Smoker    Packs/day: 1.00    Years: 30.00    Pack years: 30.00    Types: Cigarettes    Quit date: 2015    Years since quitting: 7.0  . Smokeless  tobacco: Never Used  . Tobacco comment: Quit  Vaping Use  . Vaping Use: Every day  . Substances: Nicotine, Flavoring  Substance and Sexual Activity  . Alcohol use: No  . Drug use: No  . Sexual activity: Not Currently  Other Topics Concern  . Not on file  Social History Narrative   Run errands   Watch TV   Social Determinants of Health   Financial Resource Strain: Low Risk   . Difficulty of Paying Living Expenses: Not hard at all  Food Insecurity: No Food Insecurity  . Worried About Programme researcher, broadcasting/film/video in the Last Year: Never true  . Ran Out of Food in the Last Year: Never true  Transportation Needs: No Transportation Needs  . Lack of Transportation (Medical): No  . Lack of Transportation (Non-Medical): No  Physical Activity: Insufficiently Active  . Days of Exercise per Week: 4 days  . Minutes of Exercise per Session: 20 min  Stress: No Stress Concern Present  . Feeling of Stress : Not at all  Social Connections: Moderately Integrated  . Frequency of Communication with Friends and Family: More than three times a week  .  Frequency of Social Gatherings with Friends and Family: More than three times a week  . Attends Religious Services: More than 4 times per year  . Active Member of Clubs or Organizations: Yes  . Attends Archivist Meetings: More than 4 times per year  . Marital Status: Divorced  Human resources officer Violence: Not At Risk  . Fear of Current or Ex-Partner: No  . Emotionally Abused: No  . Physically Abused: No  . Sexually Abused: No    Outpatient Medications Prior to Visit  Medication Sig Dispense Refill  . Adalimumab 40 MG/0.4ML PSKT Inject 0.4 mLs into the skin every 7 (seven) days.    . ARIPiprazole (ABILIFY) 5 MG tablet Take 1 tablet (5 mg total) by mouth daily. 90 tablet 3  . atorvastatin (LIPITOR) 10 MG tablet Take 1 tablet (10 mg total) by mouth daily. 90 tablet 3  . b complex vitamins tablet Take 1 tablet by mouth daily.    Marland Kitchen buPROPion  (WELLBUTRIN XL) 300 MG 24 hr tablet Take 1 tablet (300 mg total) by mouth daily. 90 tablet 3  . carisoprodol (SOMA) 350 MG tablet Take 1 tablet (350 mg total) by mouth 3 (three) times daily as needed. 270 tablet 1  . escitalopram (LEXAPRO) 20 MG tablet Take 1 tablet (20 mg total) by mouth daily. 90 tablet 1  . gabapentin (NEURONTIN) 800 MG tablet TAKE 1 TABLET THREE TIMES DAILY 270 tablet 1  . HYDROmorphone (DILAUDID) 4 MG tablet Take 4 mg by mouth every 8 (eight) hours.    . hydrOXYzine (ATARAX/VISTARIL) 25 MG tablet Take 1 tablet (25 mg total) by mouth 2 (two) times daily as needed. 180 tablet 1  . NARCAN 4 MG/0.1ML LIQD nasal spray kit     . traZODone (DESYREL) 100 MG tablet TAKE 2 TABLETS AT BEDTIME 180 tablet 1   No facility-administered medications prior to visit.    Allergies  Allergen Reactions  . Buspirone Other (See Comments)    "wired up, out of body" "wired up, out of body"   . Diclofenac Other (See Comments)    Stomach upset Stomach upset   . Interferon Beta-1a Other (See Comments)    'bad reaction" 'bad reaction" 'bad reaction"   . Venlafaxine Other (See Comments)    "felt like a zombie" "felt like a zombie"   . Methocarbamol Other (See Comments)  . Saccharin Nausea Only    Review of Systems     Objective:    Physical Exam Vitals reviewed.  Constitutional:      Appearance: He is well-developed and well-nourished.  HENT:     Head: Normocephalic and atraumatic.  Eyes:     Extraocular Movements: EOM normal.     Conjunctiva/sclera: Conjunctivae normal.  Cardiovascular:     Rate and Rhythm: Normal rate.  Pulmonary:     Effort: Pulmonary effort is normal.  Musculoskeletal:     Comments: Medial border of the left great toenail the skin is thickened and somewhat peeling.  No significant erythema but it is mildly swollen and very tender to touch.  Skin:    General: Skin is dry.     Coloration: Skin is not pale.  Neurological:     Mental Status: He is  alert and oriented to person, place, and time.  Psychiatric:        Mood and Affect: Mood and affect normal.        Behavior: Behavior normal.     BP 119/63   Pulse 80  Ht '5\' 8"'$  (1.727 m)   Wt 200 lb (90.7 kg)   SpO2 93%   BMI 30.41 kg/m  Wt Readings from Last 3 Encounters:  06/18/20 200 lb (90.7 kg)  06/03/20 201 lb (91.2 kg)  02/03/20 190 lb (86.2 kg)    Health Maintenance Due  Topic Date Due  . Hepatitis C Screening  Never done    There are no preventive care reminders to display for this patient.   No results found for: TSH Lab Results  Component Value Date   WBC 7.0 07/08/2013   HGB 14.9 07/08/2013   HCT 43.4 07/08/2013   MCV 92.1 07/08/2013   PLT 250 07/08/2013   Lab Results  Component Value Date   NA 138 12/04/2019   K 4.2 12/04/2019   CO2 31 12/04/2019   GLUCOSE 81 12/04/2019   BUN 11 12/04/2019   CREATININE 1.12 12/04/2019   BILITOT 0.3 12/04/2019   AST 21 12/04/2019   ALT 15 12/04/2019   PROT 6.8 12/04/2019   CALCIUM 9.1 12/04/2019   Lab Results  Component Value Date   CHOL 135 12/04/2019   Lab Results  Component Value Date   HDL 35 (L) 12/04/2019   Lab Results  Component Value Date   LDLCALC 79 12/04/2019   Lab Results  Component Value Date   TRIG 117 12/04/2019   Lab Results  Component Value Date   CHOLHDL 3.9 12/04/2019   No results found for: HGBA1C     Assessment & Plan:   Problem List Items Addressed This Visit   None   Visit Diagnoses    Ingrown toenail    -  Primary     Left ingrown toenail-discussed need for removal.  He preferred for [place] to do a partial so we did a partial medial nail removal.  Patient tolerated really well.  Follow-up instructions provided call if any concerns about wound healing.  There was no active sign of infection held off on prescribing any antibiotics.  Recommend ibuprofen and Tylenol for pain.  Toenail Avulsion Procedure Note  Pre-operative Diagnosis: Left Ingrown Great toenail    Post-operative Diagnosis: Left Ingrown Great toenail  Indications: pain and swelling  Anesthesia: Lidocaine 1% without epinephrine without added sodium bicarbonate  Procedure Details  The risks (including bleeding and infection) and benefits of the  procedure and Verbal informed consent obtained.  After digital block anesthesia was obtained, a tourniquet was applied for hemostasis during the procedure.  After prepping with Betadine, the offending edge of the nail was freed from the nailbed and perionychium, and then split with scissors and removed with  forceps.  All visible granulation tissue is debrided. Antibiotic and bulky dressing was applied.   Findings: Ingrown nial  Complications: none.  Plan: 1. Soak the foot twice daily. Change dressing twice daily until healed over. 2. Warning signs of infection were reviewed.   3. Recommended that the patient use OTC acetaminophen  Or IBU as needed for pain.  4. Return PRN.    No orders of the defined types were placed in this encounter.    Beatrice Lecher, MD

## 2020-06-18 NOTE — Patient Instructions (Signed)
Please keep area bandaged for 24 hours.  Keep elevated tonight and ice if needed for discomfort or pain as the numbing medicine wears off. Okay to use 2 extra strength Tylenol Tylenol and/or ibuprofen 600 mg every 6 hours as needed.  After 24 hours you can remove the bandage.  Okay to get wet in the shower and just apply a small dab of Vaseline to keep it moisturized.  It may drain for a couple of days so you can apply a loose Band-Aid if you would like just to avoid getting the excess drainage or moisture on your sock or shoe.

## 2020-06-22 ENCOUNTER — Telehealth: Payer: Self-pay

## 2020-06-22 NOTE — Telephone Encounter (Signed)
Patient was exposed to COVID on Thursday evening. He reports not feeling well on Saturday with a low grade temp of 99 and some coughing. Advised patient to do an at home test. He will test and let us know the results.

## 2020-06-22 NOTE — Telephone Encounter (Signed)
Patient called back to report that his test was positive. Advised patient to quarantine and treat symptoms. Advised patient to call back if symptoms worsen.

## 2020-07-20 ENCOUNTER — Other Ambulatory Visit: Payer: Self-pay | Admitting: Family Medicine

## 2020-08-13 ENCOUNTER — Other Ambulatory Visit: Payer: Self-pay | Admitting: Family Medicine

## 2020-08-13 DIAGNOSIS — G4700 Insomnia, unspecified: Secondary | ICD-10-CM

## 2020-09-15 ENCOUNTER — Other Ambulatory Visit: Payer: Self-pay | Admitting: Family Medicine

## 2020-09-15 DIAGNOSIS — M4802 Spinal stenosis, cervical region: Secondary | ICD-10-CM

## 2020-09-15 DIAGNOSIS — Z981 Arthrodesis status: Secondary | ICD-10-CM

## 2020-09-15 DIAGNOSIS — G8929 Other chronic pain: Secondary | ICD-10-CM

## 2020-09-23 ENCOUNTER — Other Ambulatory Visit: Payer: Self-pay | Admitting: Family Medicine

## 2020-12-01 ENCOUNTER — Ambulatory Visit (INDEPENDENT_AMBULATORY_CARE_PROVIDER_SITE_OTHER): Payer: Medicare Other | Admitting: Family Medicine

## 2020-12-01 ENCOUNTER — Other Ambulatory Visit: Payer: Self-pay

## 2020-12-01 ENCOUNTER — Encounter: Payer: Self-pay | Admitting: Family Medicine

## 2020-12-01 VITALS — BP 102/65 | HR 73 | Ht 68.0 in | Wt 183.4 lb

## 2020-12-01 DIAGNOSIS — E782 Mixed hyperlipidemia: Secondary | ICD-10-CM

## 2020-12-01 DIAGNOSIS — F1011 Alcohol abuse, in remission: Secondary | ICD-10-CM

## 2020-12-01 DIAGNOSIS — I700 Atherosclerosis of aorta: Secondary | ICD-10-CM

## 2020-12-01 DIAGNOSIS — Z13220 Encounter for screening for lipoid disorders: Secondary | ICD-10-CM

## 2020-12-01 DIAGNOSIS — F339 Major depressive disorder, recurrent, unspecified: Secondary | ICD-10-CM

## 2020-12-01 DIAGNOSIS — Z23 Encounter for immunization: Secondary | ICD-10-CM

## 2020-12-01 DIAGNOSIS — F419 Anxiety disorder, unspecified: Secondary | ICD-10-CM

## 2020-12-01 MED ORDER — GABAPENTIN 600 MG PO TABS: 600.0000 mg | ORAL_TABLET | Freq: Three times a day (TID) | ORAL | 1 refills | Status: DC

## 2020-12-01 NOTE — Addendum Note (Signed)
Addended by: Deno Etienne on: 12/01/2020 10:39 AM   Modules accepted: Orders

## 2020-12-01 NOTE — Assessment & Plan Note (Signed)
Discussed options.  I do think at some point I would like to try to decrease the Wellbutrin since he is on triple therapy for his mood though he is doing well just encouraged him to think about it I did not want make that change today since we are adjusting his gabapentin down slightly.  But when he comes back in 6 months we could certainly consider it.

## 2020-12-01 NOTE — Assessment & Plan Note (Signed)
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  I would like to try to decrease his gabapentin down to 600 mg and see if he tolerates it well we discussed that if he notices any change in his mood whatsoever to please let me know and we will change it back

## 2020-12-01 NOTE — Addendum Note (Signed)
Addended by: Deno Etienne on: 12/01/2020 10:37 AM   Modules accepted: Orders

## 2020-12-01 NOTE — Assessment & Plan Note (Signed)
To recheck lipids to make sure that LDL is less than 70.

## 2020-12-01 NOTE — Progress Notes (Signed)
Established Patient Office Visit  Subjective:  Patient ID: Paul Fowler, male    DOB: [birth year]  Age: 70 y.o. MRN: [id number]  CC:  Chief Complaint  Patient presents with   Follow-up    6 month follow Anxiety & medications    HPI Paul Fowler presents for follow-up depression/anxiety-he actually feels like he is doing really well on his current regimen of Lexapro, Abilify, Wellbutrin and uses trazodone for sleep.  Follow-up aortic atherosclerosis.  He is on a daily statin and tolerating well.  Due to recheck lipids.  Follow-up alcoholism-he has been sober now for 4 years as of Sunday he still continues to go to his meetings and check in with his support person.  He has been on gabapentin since 2012 when he was in rehab at that time.  Past Medical History:  Diagnosis Date   Arthritis    Crohn's disease (HCC)    GERD (gastroesophageal reflux disease)    occasionally has indigestion   Multiple sclerosis (HCC)    DX 3-4 YRS AGO-- ASYMPTOMATIC   Neuromuscular disorder (HCC)    MS  ON NO MEDS    Ulcerative colitis (HCC)    LAST FLAREUP SPRING 2014    Past Surgical History:  Procedure Laterality Date   BACK SURGERY     CERVICAL SPINE SURGERY     FUSION   ELBOW SURGERY     LEFT   MAXIMUM ACCESS (MAS)POSTERIOR LUMBAR INTERBODY FUSION (PLIF) 1 LEVEL N/A 07/11/2013   Procedure: FOR MAXIMUM ACCESS (MAS) POSTERIOR LUMBAR INTERBODY FUSION (PLIF) 1 LEVEL LUMBAR FOUR-FIVE;  Surgeon: Tia Alert, MD;  Location: [facility];  Service: Neurosurgery;  Laterality: N/A;   THUMB ARTHROSCOPY     LEFT   TONSILLECTOMY     TORN ROTATOR     LEFT SHOULDER    History reviewed. No pertinent family history.  Social History   Socioeconomic History   Marital status: Divorced    Spouse name: Not on file   Number of children: 2   Years of education: 14   Highest education level: Associate degree: academic program  Occupational History   Occupation: Journalist, newspaper     Comment: retired  Tobacco Use   Smoking status: Former    Packs/day: 1.00    Years: 30.00    Pack years: 30.00    Types: Cigarettes    Quit date: 2015    Years since quitting: 7.5   Smokeless tobacco: Never   Tobacco comments:    Quit  Building services engineer Use: Every day   Substances: Nicotine, Flavoring  Substance and Sexual Activity   Alcohol use: No   Drug use: No   Sexual activity: Not Currently  Other Topics Concern   Not on file  Social History Narrative   Run errands   Watch TV   Social Determinants of Health   Financial Resource Strain: Low Risk    Difficulty of Paying Living Expenses: Not hard at all  Food Insecurity: No Food Insecurity   Worried About Programme researcher, broadcasting/film/video in the Last Year: Never true   Barista in the Last Year: Never true  Transportation Needs: No Transportation Needs   Lack of Transportation (Medical): No   Lack of Transportation (Non-Medical): No  Physical Activity: Insufficiently Active   Days of Exercise per Week: 4 days   Minutes of Exercise per Session: 20 min  Stress: No Stress Concern Present   Feeling of  Stress : Not at all  Social Connections: Moderately Integrated   Frequency of Communication with Friends and Family: More than three times a week   Frequency of Social Gatherings with Friends and Family: More than three times a week   Attends Religious Services: More than 4 times per year   Active Member of [employer] or Organizations: Yes   Attends Music therapist: More than 4 times per year   Marital Status: Divorced  Human resources officer Violence: Not At Risk   Fear of Current or Ex-Partner: No   Emotionally Abused: No   Physically Abused: No   Sexually Abused: No    Outpatient Medications Prior to Visit  Medication Sig Dispense Refill   Adalimumab 40 MG/0.4ML PSKT Inject 0.4 mLs into the skin every 7 (seven) days.     ARIPiprazole (ABILIFY) 5 MG tablet Take 1 tablet (5 mg total) by mouth daily. 90 tablet 3    atorvastatin (LIPITOR) 10 MG tablet Take 1 tablet (10 mg total) by mouth daily. 90 tablet 3   b complex vitamins tablet Take 1 tablet by mouth daily.     buPROPion (WELLBUTRIN XL) 300 MG 24 hr tablet Take 1 tablet (300 mg total) by mouth daily. 90 tablet 3   carisoprodol (SOMA) 350 MG tablet TAKE 1 TABLET (350 MG TOTAL) BY MOUTH 3 (THREE) TIMES DAILY AS NEEDED. 270 tablet 1   escitalopram (LEXAPRO) 20 MG tablet TAKE 1 TABLET EVERY DAY 90 tablet 1   HYDROmorphone (DILAUDID) 4 MG tablet Take 4 mg by mouth every 6 (six) hours as needed for severe pain. Dr. Carnella Guadalajara     hydrOXYzine (ATARAX/VISTARIL) 25 MG tablet TAKE 1 TABLET TWICE DAILY AS NEEDED 180 tablet 1   NARCAN 4 MG/0.1ML LIQD nasal spray kit      traZODone (DESYREL) 100 MG tablet TAKE 2 TABLETS AT BEDTIME 180 tablet 1   gabapentin (NEURONTIN) 800 MG tablet Take 1 tablet (800 mg total) by mouth 3 (three) times daily. Needs follow up appointment in July. 270 tablet 0   HYDROmorphone (DILAUDID) 4 MG tablet Take 4 mg by mouth every 8 (eight) hours.     No facility-administered medications prior to visit.    Allergies  Allergen Reactions   Buspirone Other (See Comments)    "wired up, out of body" "wired up, out of body"    Diclofenac Other (See Comments)    Stomach upset Stomach upset    Interferon Beta-1a Other (See Comments)    'bad reaction" 'bad reaction" 'bad reaction"    Venlafaxine Other (See Comments)    "felt like a zombie" "felt like a zombie"    Methocarbamol Other (See Comments)   Saccharin Nausea Only    ROS Review of Systems    Objective:    Physical Exam  BP 102/65   Pulse 73   Ht $R'5\' 8"'xt$  (1.727 m)   Wt 183 lb 6.4 oz (83.2 kg)   SpO2 95%   BMI 27.89 kg/m  Wt Readings from Last 3 Encounters:  12/01/20 183 lb 6.4 oz (83.2 kg)  06/18/20 200 lb (90.7 kg)  06/03/20 201 lb (91.2 kg)     Health Maintenance Due  Topic Date Due   Hepatitis C Screening  Never done   Zoster Vaccines- Shingrix (2 of 2)  12/12/2019   COVID-19 Vaccine (4 - Booster for Moderna series) 06/23/2020   PNA vac Low Risk Adult (2 of 2 - PPSV23) 10/16/2020    There are no preventive  care reminders to display for this patient.  No results found for: TSH Lab Results  Component Value Date   WBC 7.0 07/08/2013   HGB 14.9 07/08/2013   HCT 43.4 07/08/2013   MCV 92.1 07/08/2013   PLT 250 07/08/2013   Lab Results  Component Value Date   NA 138 12/04/2019   K 4.2 12/04/2019   CO2 31 12/04/2019   GLUCOSE 81 12/04/2019   BUN 11 12/04/2019   CREATININE 1.12 12/04/2019   BILITOT 0.3 12/04/2019   AST 21 12/04/2019   ALT 15 12/04/2019   PROT 6.8 12/04/2019   CALCIUM 9.1 12/04/2019   Lab Results  Component Value Date   CHOL 135 12/04/2019   Lab Results  Component Value Date   HDL 35 (L) 12/04/2019   Lab Results  Component Value Date   LDLCALC 79 12/04/2019   Lab Results  Component Value Date   TRIG 117 12/04/2019   Lab Results  Component Value Date   CHOLHDL 3.9 12/04/2019   No results found for: HGBA1C    Assessment & Plan:   Problem List Items Addressed This Visit       Cardiovascular and Mediastinum   Atherosclerosis of aorta ([place])    To recheck lipids to make sure that LDL is less than 70.       Relevant Orders   CBC   COMPLETE METABOLIC PANEL WITH GFR   Lipid panel     Musculoskeletal and Integument   Dysbetalipoproteinemia   Relevant Orders   CBC   COMPLETE METABOLIC PANEL WITH GFR   Lipid panel     Other   Depression, recurrent ([place]) - Primary    Discussed options.  I do think at some point I would like to try to decrease the Wellbutrin since he is on triple therapy for his mood though he is doing well just encouraged him to think about it I did not want make that change today since we are adjusting his gabapentin down slightly.  But when he comes back in 6 months we could certainly consider it.       Anxiety disorder   Alcohol abuse, in remission    Is now 4 years  sober which is phenomenal.  I would like to try to decrease his gabapentin down to 600 mg and see if he tolerates it well we discussed that if he notices any change in his mood whatsoever to please let me know and we will change it back       Other Visit Diagnoses     Screening, lipid       Relevant Orders   PSA       Meds ordered this encounter  Medications   gabapentin (NEURONTIN) 600 MG tablet    Sig: Take 1 tablet (600 mg total) by mouth 3 (three) times daily. Needs follow up appointment in July.    Dispense:  270 tablet    Refill:  1    Follow-up: Return in about 6 months (around 06/03/2021) for Mood and medications.  Beatrice Lecher, MD

## 2020-12-14 ENCOUNTER — Other Ambulatory Visit: Payer: Self-pay | Admitting: Family Medicine

## 2020-12-18 IMAGING — DX LEFT HAND - COMPLETE 3+ VIEW
3 series · 3 of 3 positions shown · non-contrast
Comparison: None.

CLINICAL DATA: Increasing pain at the left second IP joint, post
blunt injury to the finger.

EXAM:
LEFT HAND - COMPLETE 3+ VIEW

[hand pa]
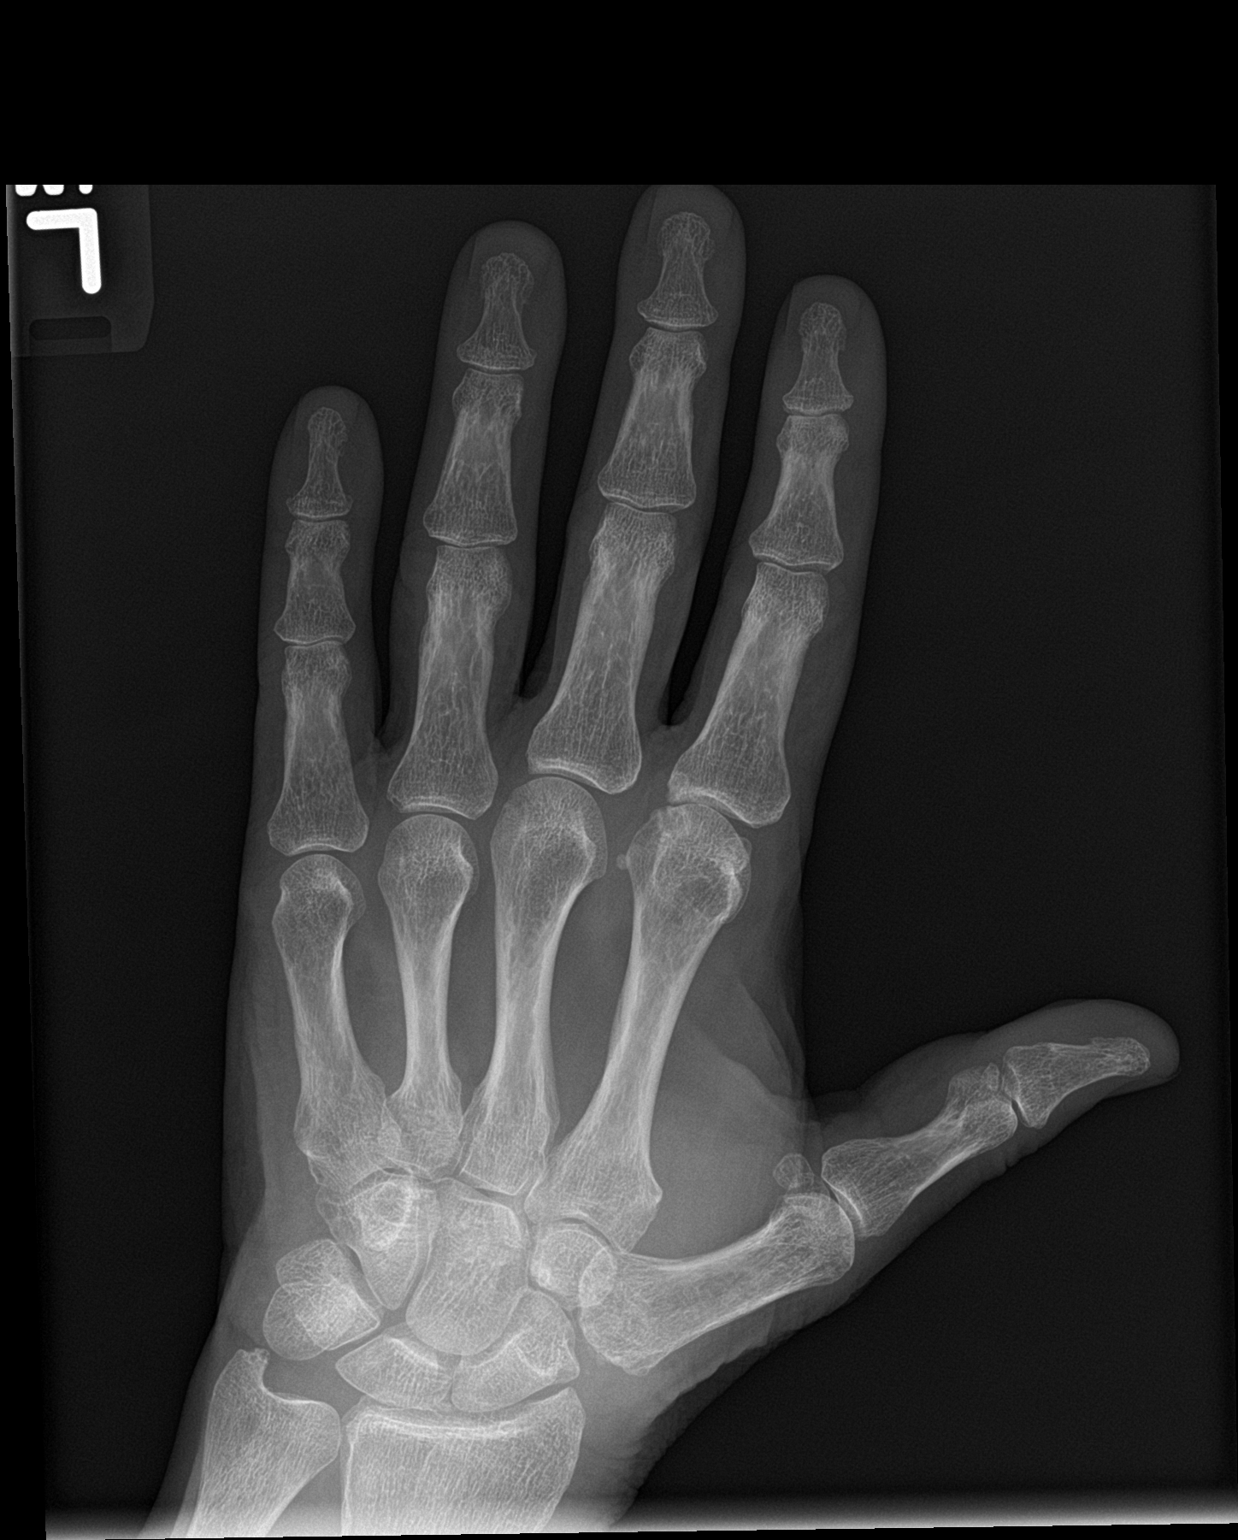

[hand obl]
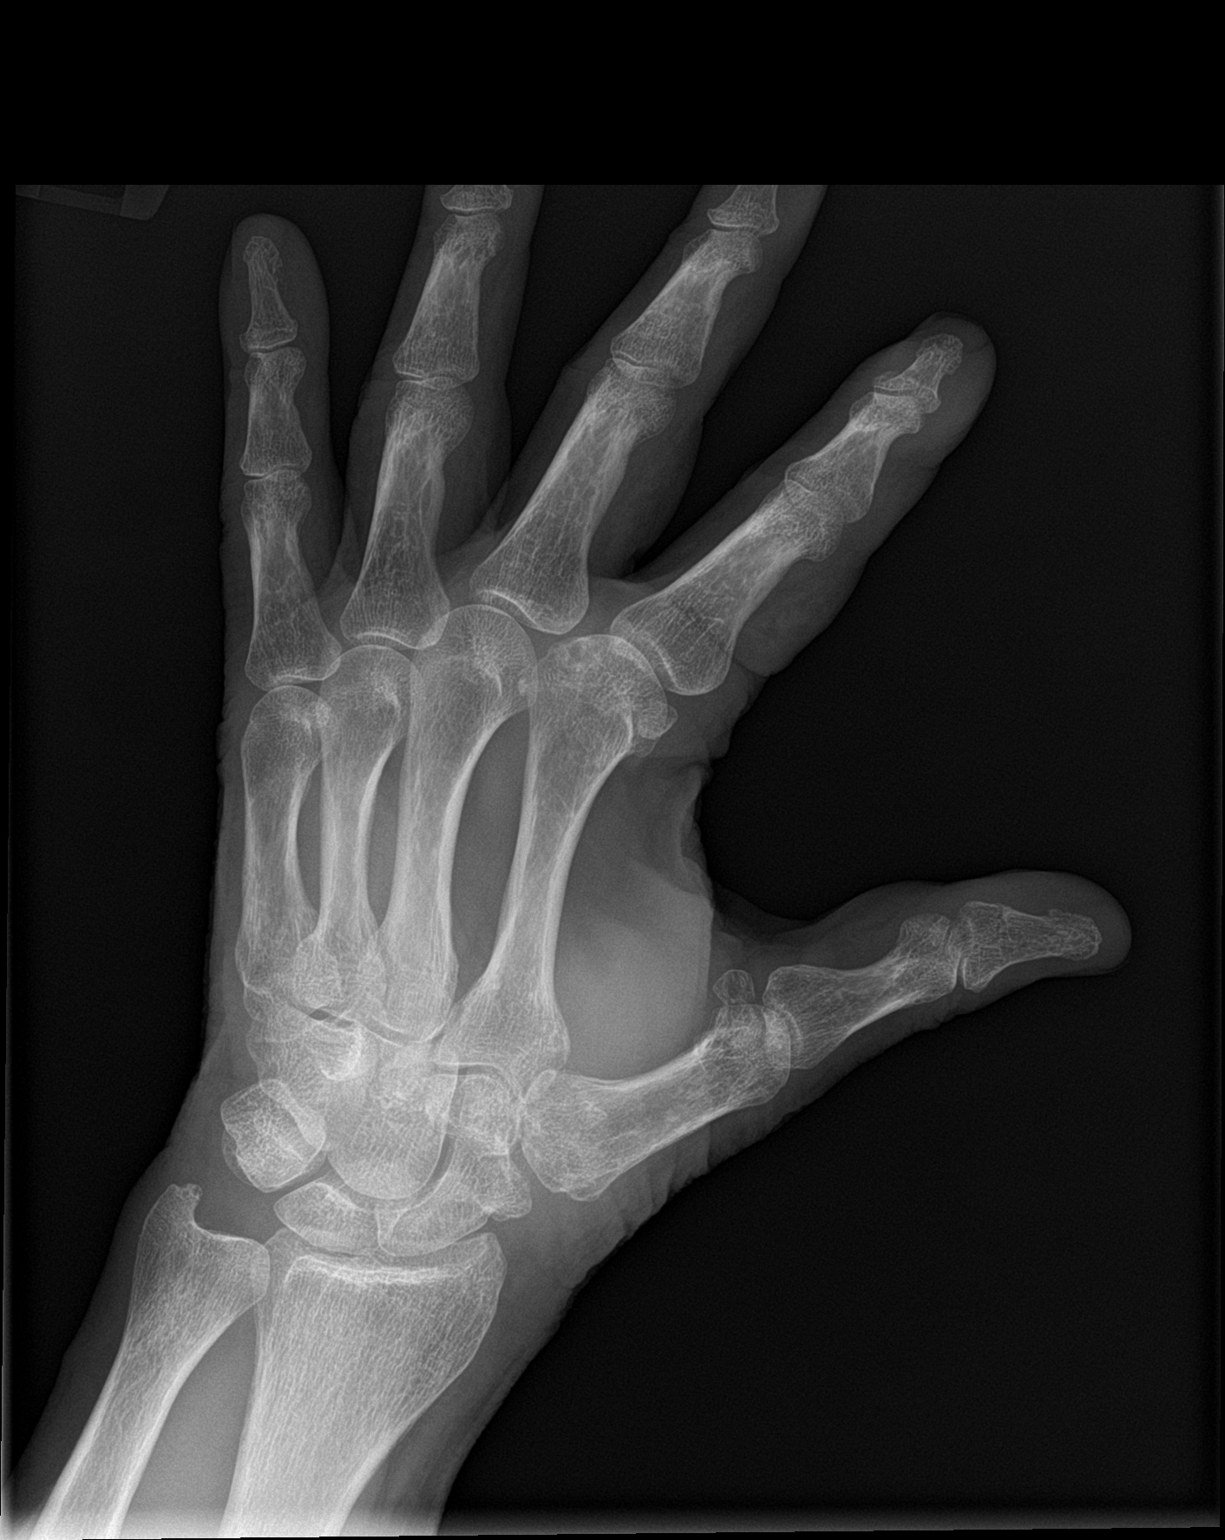

[hand lat]
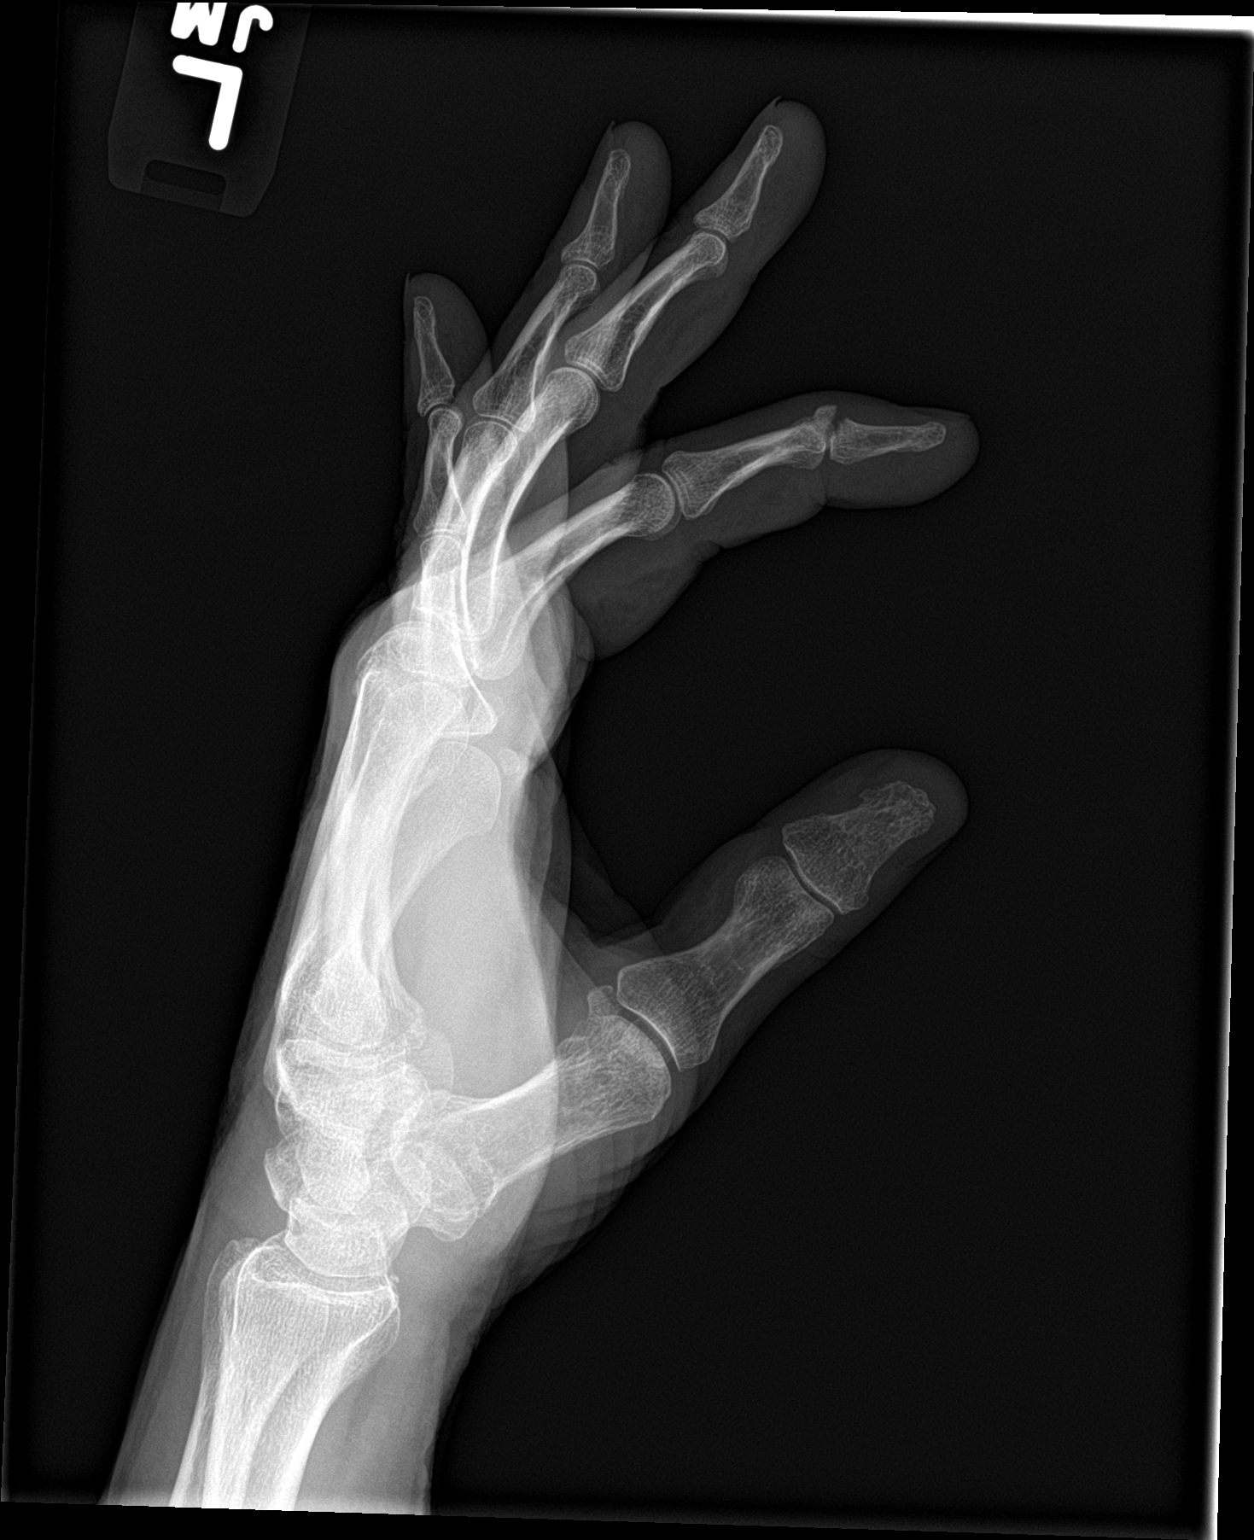

[3 of 3 positions shown; findings below may reference images not displayed]

FINDINGS: There is an avulsion fracture off of the dorsal surface of the
proximal second phalanx at the DIP joint without significant
displacement. Associated soft tissue swelling.
IMPRESSION: Avulsion fracture off of the dorsal surface of the proximal second
phalanx at the DIP joint without significant displacement.

## 2021-01-04 ENCOUNTER — Telehealth: Payer: Self-pay | Admitting: Acute Care

## 2021-01-04 DIAGNOSIS — Z87891 Personal history of nicotine dependence: Secondary | ICD-10-CM

## 2021-01-04 NOTE — Telephone Encounter (Signed)
Paul Fowler the patient can;t schedule his Ct for 8/23 and his [facility] CT order expires tomorrow. If  you will put in new order I will get him scheduled this week

## 2021-01-05 NOTE — Telephone Encounter (Signed)
Patient's LCS CT  has been scheduled for 01/07/21 @ 11:00am and he is aware of the appt and location

## 2021-01-05 NOTE — Telephone Encounter (Signed)
New  CT order placed

## 2021-01-07 ENCOUNTER — Other Ambulatory Visit: Payer: Self-pay

## 2021-01-07 ENCOUNTER — Ambulatory Visit (INDEPENDENT_AMBULATORY_CARE_PROVIDER_SITE_OTHER): Payer: Medicare Other

## 2021-01-07 DIAGNOSIS — Z87891 Personal history of nicotine dependence: Secondary | ICD-10-CM

## 2021-01-12 LAB — LIPID PANEL
Cholesterol: 173 mg/dL (ref <200)
HDL: 51 mg/dL (ref >=40)
LDL Cholesterol (Calc): 105 mg/dL (calc) — ABNORMAL HIGH
Non-HDL Cholesterol (Calc): 122 mg/dL (calc) (ref <130)
Total CHOL/HDL Ratio: 3.4 (calc) (ref <5.0)
Triglycerides: 82 mg/dL (ref <150)

## 2021-01-12 LAB — COMPLETE METABOLIC PANEL WITH GFR
AG Ratio: 1.5 (calc) (ref 1.0–2.5)
ALT: 11 U/L (ref 9–46)
AST: 17 U/L (ref 10–35)
Albumin: 4.1 g/dL (ref 3.6–5.1)
Alkaline phosphatase (APISO): 74 U/L (ref 35–144)
BUN: 10 mg/dL (ref 7–25)
CO2: 34 mmol/L — ABNORMAL HIGH (ref 20–32)
Calcium: 9 mg/dL (ref 8.6–10.3)
Chloride: 103 mmol/L (ref 98–110)
Creat: 0.88 mg/dL (ref 0.70–1.35)
Globulin: 2.7 g/dL (calc) (ref 1.9–3.7)
Glucose, Bld: 104 mg/dL — ABNORMAL HIGH (ref 65–99)
Potassium: 4.2 mmol/L (ref 3.5–5.3)
Sodium: 140 mmol/L (ref 135–146)
Total Bilirubin: 0.3 mg/dL (ref 0.2–1.2)
Total Protein: 6.8 g/dL (ref 6.1–8.1)
eGFR: 93 mL/min/{1.73_m2} (ref >=60)

## 2021-01-12 LAB — CBC
HCT: 44.5 % (ref 38.5–50.0)
Hemoglobin: 14.5 g/dL (ref 13.2–17.1)
MCH: 30.9 pg (ref 27.0–33.0)
MCHC: 32.6 g/dL (ref 32.0–36.0)
MCV: 94.9 fL (ref 80.0–100.0)
MPV: 9.9 fL (ref 7.5–12.5)
Platelets: 249 10*3/uL (ref 140–400)
RBC: 4.69 10*6/uL (ref 4.20–5.80)
RDW: 12.3 % (ref 11.0–15.0)
WBC: 7.3 10*3/uL (ref 3.8–10.8)

## 2021-01-12 LAB — PSA: PSA: 2.01 ng/mL (ref <=4.00)

## 2021-01-12 NOTE — Progress Notes (Signed)
HiKen,  Your blood count looks great.  Metabolic panel is okay except your glucose is just borderline we will keep an eye on that.  LDL cholesterol did jump up compared to the last year.  Continue work on [employer] and regular exercise.  State test is normal.

## 2021-01-17 ENCOUNTER — Other Ambulatory Visit: Payer: Self-pay | Admitting: Family Medicine

## 2021-01-20 NOTE — Progress Notes (Signed)
Please call patient and let them  know their  low dose Ct was read as a Lung RADS 2: nodules that are benign in appearance and behavior with a very low likelihood of becoming a clinically active cancer due to size or lack of growth. Recommendation per radiology is for a repeat LDCT in 12 months. .Please let them  know we will order and schedule their  annual screening scan for 12/2021. Please let them  know there was notation of CAD on their  scan.  Please remind the patient  that this is a non-gated exam therefore degree or severity of disease  cannot be determined. Please have them  follow up with their PCP regarding potential risk factor modification, dietary therapy or pharmacologic therapy if clinically indicated. Pt.  is  currently on statin therapy. Please place order for annual  screening scan for  12/2021 and fax results to PCP. Thanks so much.  + CAD, + Statin, No cardiology notes, so have them follow up with PCP regarding echo.Thanks so much

## 2021-01-21 ENCOUNTER — Encounter: Payer: Self-pay | Admitting: *Deleted

## 2021-01-21 DIAGNOSIS — Z87891 Personal history of nicotine dependence: Secondary | ICD-10-CM

## 2021-01-22 ENCOUNTER — Telehealth: Payer: Self-pay | Admitting: Family Medicine

## 2021-01-22 DIAGNOSIS — I2510 Atherosclerotic heart disease of native coronary artery without angina pectoris: Secondary | ICD-10-CM

## 2021-01-22 MED ORDER — ATORVASTATIN CALCIUM 20 MG PO TABS: 20.0000 mg | ORAL_TABLET | Freq: Every day | ORAL | 3 refills | Status: DC

## 2021-01-22 NOTE — Telephone Encounter (Signed)
Please call patient and let him know that I get it did get a copy of his CT results.  I have 2 recommendations 1 is to increase his atorvastatin to do a better job of reducing plaque formation.  In addition I recommend a consultation with cardiology since they did see some coronary artery disease just to make sure that there are no other risk factors that we can work on her modify to reduce risk for significant cardiovascular disease.  He is okay with making the statin change then please increase atorvastatin to 20 mg daily, 90 tabs with 3 refills.

## 2021-01-22 NOTE — Telephone Encounter (Signed)
Pt advised of recommendations.  Pt is agreeable to increased dose of atorvastatin and referral to cardiology. New RX sent to pharmacy per Dr. Shelah Lewandowsky instructions and referral placed to cardiology.  Paul Fowler, CMA

## 2021-01-23 ENCOUNTER — Other Ambulatory Visit: Payer: Self-pay | Admitting: Family Medicine

## 2021-01-28 ENCOUNTER — Other Ambulatory Visit: Payer: Self-pay | Admitting: Family Medicine

## 2021-01-28 DIAGNOSIS — Z981 Arthrodesis status: Secondary | ICD-10-CM

## 2021-01-28 DIAGNOSIS — M4802 Spinal stenosis, cervical region: Secondary | ICD-10-CM

## 2021-01-28 DIAGNOSIS — M545 Low back pain, unspecified: Secondary | ICD-10-CM

## 2021-01-29 NOTE — Telephone Encounter (Signed)
LMOM for pt to return call. 

## 2021-01-29 NOTE — Telephone Encounter (Signed)
Please call pt and let him know I did refill his some for 3 months, but I want to him to discuss with Dr. Oneal Grout taking over that prescription since it really is for his back and his pain really does not make a lot of sense if she is writing his pain medication but I am writing his muscle relaxer so it really needs to come from one provider but he has 90-day supplies so that he has time to speak with her about this at his next follow-up appointment.  Also since he is over 35 this is a contraindicated medication technically.  So she might even want to look at some alternatives as this really is not a good long-term solution especially because he takes it frequently.

## 2021-01-30 ENCOUNTER — Other Ambulatory Visit: Payer: Self-pay | Admitting: Family Medicine

## 2021-01-30 DIAGNOSIS — G4700 Insomnia, unspecified: Secondary | ICD-10-CM

## 2021-02-01 NOTE — Telephone Encounter (Signed)
Pt informed.  Pt expressed understanding and is agreeable.  T. Berkeley Veldman, CMA  

## 2021-02-23 NOTE — Progress Notes (Signed)
Referring-coronary artery disease Reason for referral-Catherine Metheney MD  HPI: 70 year old male for evaluation of coronary artery disease at request of Beatrice Lecher MD.  Abdominal ultrasound August 2021 showed no aneurysm.  Chest CT August 2022 showed aortic atherosclerosis and coronary calcification including left main.  There was also note of emphysema and mild calcification of the aortic valve.  Cardiology now asked to evaluate.  Patient has dyspnea with more vigorous activities relieved with rest.  No orthopnea, PND, pedal edema, claudication, chest pain or syncope.  Current Outpatient Medications  Medication Sig Dispense Refill   Adalimumab 40 MG/0.4ML PSKT Inject 0.4 mLs into the skin every 7 (seven) days.     ARIPiprazole (ABILIFY) 5 MG tablet Take 1 tablet (5 mg total) by mouth daily. 90 tablet 3   atorvastatin (LIPITOR) 20 MG tablet Take 1 tablet (20 mg total) by mouth daily. 90 tablet 3   b complex vitamins tablet Take 1 tablet by mouth daily.     buPROPion (WELLBUTRIN XL) 300 MG 24 hr tablet TAKE 1 TABLET EVERY DAY 90 tablet 3   carisoprodol (SOMA) 350 MG tablet TAKE 1 TABLET THREE TIMES DAILY AS NEEDED 270 tablet 0   escitalopram (LEXAPRO) 20 MG tablet TAKE 1 TABLET EVERY DAY 90 tablet 1   gabapentin (NEURONTIN) 600 MG tablet Take 1 tablet (600 mg total) by mouth 3 (three) times daily. 270 tablet 1   HYDROmorphone (DILAUDID) 4 MG tablet Take 4 mg by mouth every 6 (six) hours as needed for severe pain. Dr. Carnella Guadalajara     hydrOXYzine (ATARAX/VISTARIL) 25 MG tablet TAKE 1 TABLET TWICE DAILY AS NEEDED 180 tablet 1   NARCAN 4 MG/0.1ML LIQD nasal spray kit      traZODone (DESYREL) 100 MG tablet TAKE 2 TABLETS AT BEDTIME 180 tablet 1   No current facility-administered medications for this visit.    Allergies  Allergen Reactions   Buspirone Other (See Comments)    "wired up, out of body" "wired up, out of body"    Diclofenac Other (See Comments)    Stomach  upset Stomach upset    Interferon Beta-1a Other (See Comments)    'bad reaction" 'bad reaction" 'bad reaction"    Venlafaxine Other (See Comments)    "felt like a zombie" "felt like a zombie"    Methocarbamol Other (See Comments)   Saccharin Nausea Only     Past Medical History:  Diagnosis Date   Arthritis    Crohn's disease ([place])    GERD (gastroesophageal reflux disease)    occasionally has indigestion   Hyperlipidemia    Multiple sclerosis ([place])    DX 3-4 YRS AGO-- ASYMPTOMATIC   Neuromuscular disorder ([place])    MS  ON NO MEDS    Ulcerative colitis ([place])    LAST FLAREUP [place] 2014    Past Surgical History:  Procedure Laterality Date   BACK SURGERY     CERVICAL SPINE SURGERY     FUSION   ELBOW SURGERY     LEFT   MAXIMUM ACCESS (MAS)POSTERIOR LUMBAR INTERBODY FUSION (PLIF) 1 LEVEL N/A 07/11/2013   Procedure: FOR MAXIMUM ACCESS (MAS) POSTERIOR LUMBAR INTERBODY FUSION (PLIF) 1 LEVEL LUMBAR FOUR-FIVE;  Surgeon: Eustace Moore, MD;  Location: [facility];  Service: Neurosurgery;  Laterality: N/A;   THUMB ARTHROSCOPY     LEFT   TONSILLECTOMY     TORN ROTATOR     LEFT SHOULDER    Social History   Socioeconomic History  Marital status: Divorced    Spouse name: Not on file   Number of children: 2   Years of education: 14   Highest education level: Associate degree: academic program  Occupational History   Occupation: Psychiatric nurse    Comment: retired  Tobacco Use   Smoking status: Former    Packs/day: 1.00    Years: 30.00    Pack years: 30.00    Types: Cigarettes    Quit date: 2015    Years since quitting: 7.7   Smokeless tobacco: Never   Tobacco comments:    Quit  Scientific laboratory technician Use: Every day   Substances: Nicotine, Flavoring  Substance and Sexual Activity   Alcohol use: No   Drug use: No   Sexual activity: Not Currently  Other Topics Concern   Not on file  Social History Narrative   Run errands   Watch TV   Social  Determinants of Health   Financial Resource Strain: Not on file  Food Insecurity: Not on file  Transportation Needs: Not on file  Physical Activity: Not on file  Stress: Not on file  Social Connections: Not on file  Intimate Partner Violence: Not on file    Family History  Problem Relation Age of Onset   Cancer Father     ROS: Back pain and neck pain but no fevers or chills, productive cough, hemoptysis, dysphasia, odynophagia, melena, hematochezia, dysuria, hematuria, rash, seizure activity, orthopnea, PND, pedal edema, claudication. Remaining systems are negative.  Physical Exam:   Blood pressure 118/77, pulse 76, height $RemoveBe'5\' 10"'MkluixXsh$  (1.778 m), weight 195 lb 12.8 oz (88.8 kg), SpO2 (!) 89 %.  General:  Well developed/well nourished in NAD Skin warm/dry Patient not depressed No peripheral clubbing Back-normal HEENT-normal/normal eyelids Neck supple/normal carotid upstroke bilaterally; no bruits; no JVD; no thyromegaly chest - CTA/ normal expansion CV - RRR/normal S1 and S2; no murmurs, rubs or gallops;  PMI nondisplaced Abdomen -NT/ND, no HSM, no mass, + bowel sounds, no bruit 2+ femoral pulses, no bruits Ext-no edema, chords, 2+ DP Neuro-grossly nonfocal  ECG -normal sinus rhythm at a rate of 76, no ST changes.  Personally reviewed  A/P  1 coronary artery disease-based on CT scan demonstrated coronary calcification.  He has some dyspnea on exertion which may be related to COPD and his history of tobacco use.  However I will arrange a [facility] nuclear study to screen for ischemia as a contributor.  We will continue aspirin 81 mg daily.  Continue statin.  2 hyperlipidemia-given documented coronary disease I will increase Lipitor to 80 mg daily.  Check lipids and liver in 8 weeks.  3 vaping-I have asked him to avoid all forms of tobacco and vaping.  Kirk Ruths, MD

## 2021-03-01 ENCOUNTER — Encounter: Payer: Self-pay | Admitting: *Deleted

## 2021-03-01 ENCOUNTER — Encounter: Payer: Self-pay | Admitting: Cardiology

## 2021-03-01 ENCOUNTER — Other Ambulatory Visit: Payer: Self-pay

## 2021-03-01 ENCOUNTER — Ambulatory Visit: Payer: Medicare Other | Admitting: Cardiology

## 2021-03-01 ENCOUNTER — Ambulatory Visit (INDEPENDENT_AMBULATORY_CARE_PROVIDER_SITE_OTHER): Payer: Medicare Other | Admitting: Cardiology

## 2021-03-01 VITALS — BP 118/77 | HR 76 | Ht 70.0 in | Wt 195.8 lb

## 2021-03-01 DIAGNOSIS — I2510 Atherosclerotic heart disease of native coronary artery without angina pectoris: Secondary | ICD-10-CM

## 2021-03-01 DIAGNOSIS — E7800 Pure hypercholesterolemia, unspecified: Secondary | ICD-10-CM

## 2021-03-01 MED ORDER — ASPIRIN EC 81 MG PO TBEC: 81.0000 mg | DELAYED_RELEASE_TABLET | Freq: Every day | ORAL | 3 refills | Status: DC

## 2021-03-01 MED ORDER — ATORVASTATIN CALCIUM 80 MG PO TABS: 80.0000 mg | ORAL_TABLET | Freq: Every day | ORAL | 3 refills | Status: DC

## 2021-03-01 NOTE — Patient Instructions (Signed)
Medication Instructions:   INCREASE ATORVASTATIN TO 80 MG ONCE DAILY=4 OF THE 20 MG TABLET ONCE DAILY  *If you need a refill on your cardiac medications before your next appointment, please call your pharmacy*   Lab Work: Your physician recommends that you return for lab work in: [facility]  If you have labs (blood work) drawn today and your tests are completely normal, you will receive your results only by: MyChart Message (if you have MyChart) OR A paper copy in the mail If you have any lab test that is abnormal or we need to change your treatment, we will call you to review the results.   Testing/Procedures:  Your physician has requested that you have a lexiscan myoview. For further information please visit [email or web address] Please follow instruction sheet, as given. [address]   Follow-Up: At [facility], you and your health needs are our priority.  As part of our continuing mission to provide you with exceptional heart care, we have created designated Provider Care Teams.  These Care Teams include your primary Cardiologist (physician) and Advanced Practice Providers (APPs -  Physician Assistants and Nurse Practitioners) who all work together to provide you with the care you need, when you need it.  We recommend signing up for the patient portal called "MyChart".  Sign up information is provided on this After Visit Summary.  MyChart is used to connect with patients for Virtual Visits (Telemedicine).  Patients are able to view lab/test results, encounter notes, upcoming appointments, etc.  Non-urgent messages can be sent to your provider as well.   To learn more about what you can do with MyChart, go to [email or web address].    Your next appointment:   12 month(s)  The format for your next appointment:   In Person  Provider:   Olga Millers, MD

## 2021-03-03 ENCOUNTER — Telehealth (HOSPITAL_COMMUNITY): Payer: Self-pay | Admitting: *Deleted

## 2021-03-03 NOTE — Telephone Encounter (Signed)
Patient given detailed instructions per Myocardial Perfusion Study Information Sheet for the test on 03/10/21 at 10:15. Patient notified to arrive 15 minutes early and that it is imperative to arrive on time for appointment to keep from having the test rescheduled.  If you need to cancel or reschedule your appointment, please call the office within 24 hours of your appointment. . Patient verbalized understanding.Paul Fowler

## 2021-03-09 ENCOUNTER — Encounter: Payer: Self-pay | Admitting: Family Medicine

## 2021-03-09 ENCOUNTER — Other Ambulatory Visit: Payer: Self-pay

## 2021-03-09 ENCOUNTER — Ambulatory Visit (INDEPENDENT_AMBULATORY_CARE_PROVIDER_SITE_OTHER): Payer: Medicare Other | Admitting: Family Medicine

## 2021-03-09 VITALS — BP 103/48 | HR 59 | Ht 70.0 in | Wt 198.0 lb

## 2021-03-09 DIAGNOSIS — I2510 Atherosclerotic heart disease of native coronary artery without angina pectoris: Secondary | ICD-10-CM

## 2021-03-09 DIAGNOSIS — F17290 Nicotine dependence, other tobacco product, uncomplicated: Secondary | ICD-10-CM | POA: Diagnosis not present

## 2021-03-09 DIAGNOSIS — M545 Low back pain, unspecified: Secondary | ICD-10-CM

## 2021-03-09 DIAGNOSIS — Z23 Encounter for immunization: Secondary | ICD-10-CM

## 2021-03-09 DIAGNOSIS — N529 Male erectile dysfunction, unspecified: Secondary | ICD-10-CM | POA: Diagnosis not present

## 2021-03-09 DIAGNOSIS — I700 Atherosclerosis of aorta: Secondary | ICD-10-CM

## 2021-03-09 DIAGNOSIS — Z720 Tobacco use: Secondary | ICD-10-CM

## 2021-03-09 DIAGNOSIS — G8929 Other chronic pain: Secondary | ICD-10-CM

## 2021-03-09 MED ORDER — VARENICLINE TARTRATE 1 MG PO TABS: ORAL_TABLET | ORAL | 1 refills | Status: DC

## 2021-03-09 MED ORDER — SILDENAFIL CITRATE 100 MG PO TABS: 50.0000 mg | ORAL_TABLET | Freq: Every day | ORAL | 3 refills | Status: DC | PRN

## 2021-03-09 NOTE — Assessment & Plan Note (Signed)
We did discuss the need for the increased dose on his statin.  He currently does not really exercise.  We did discuss that he could start working out regularly lose about 10 or 15 pounds which I think would be helpful and we could go from there he really wants to be able to come down on his dose may be to 40 mg instead of 80 mg I think if he puts in some major lifestyle changes and is able to keep his LDL down less than 70 that might be a reasonable option.

## 2021-03-09 NOTE — Progress Notes (Signed)
Pt would like to discuss the recent change that Dr. Jens Som made to the Atorvastatin he was previously taking 20 mg he is now taking 80 mg.

## 2021-03-09 NOTE — Assessment & Plan Note (Signed)
Prescription sent for Viagra 100 mg.

## 2021-03-09 NOTE — Progress Notes (Signed)
Established Patient Office Visit  Subjective:  Patient ID: Paul Fowler, male    DOB: 1950-05-27  Age: 70 y.o. MRN: [id number]  CC:  Chief Complaint  Patient presents with   Follow-up    HPI Paul Fowler presents for   F/U depression -overall he feels like he is doing well he says at some point he would really like to consider tapering the Abilify he has been on the Wellbutrin and Lexapro for years he feels like the Wellbutrin does a really good job of leveling out his irritability and anger.  The last medication that was added was the Abilify several years ago.  Right now he does not want to make a change because he is working on tapering the gabapentin.  F/U hyperlipidemia -last LDL was elevated at 105.  He has a history of aortic atherosclerosis.  He saw cardiology and they recommended increasing his statin from 20 mg to 80 mg.  F/U ED -he would like to try the Viagra again.  We did discuss that ED is often times a sign of vascular disease.  F/U chronic use of soma  -did speak with his pain specialist and they are not interested in taking over this prescription.  We did discuss working towards getting off of it eventually.  But for now he is going to work on decreasing the frequency to twice a day.  He wants to continue to work on tapering the gabapentin.  He still has a little bit of the 800s left and then will switch to the 600s that we had sent in at the last office visit.  He would like to quit vaping.  He has used Chantix in the past Cecily to quit smoking and is so would like to consider trying it again  Past Medical History:  Diagnosis Date   Arthritis    Crohn's disease ([place])    GERD (gastroesophageal reflux disease)    occasionally has indigestion   Hyperlipidemia    Multiple sclerosis ([place])    DX 3-4 YRS AGO-- ASYMPTOMATIC   Neuromuscular disorder ([place])    MS  ON NO MEDS    Ulcerative colitis ([place])    LAST FLAREUP SPRING 2014    Past Surgical History:   Procedure Laterality Date   BACK SURGERY     CERVICAL SPINE SURGERY     FUSION   ELBOW SURGERY     LEFT   MAXIMUM ACCESS (MAS)POSTERIOR LUMBAR INTERBODY FUSION (PLIF) 1 LEVEL N/A 07/11/2013   Procedure: FOR MAXIMUM ACCESS (MAS) POSTERIOR LUMBAR INTERBODY FUSION (PLIF) 1 LEVEL LUMBAR FOUR-FIVE;  Surgeon: Eustace Moore, MD;  Location: [facility];  Service: Neurosurgery;  Laterality: N/A;   THUMB ARTHROSCOPY     LEFT   TONSILLECTOMY     TORN ROTATOR     LEFT SHOULDER    Family History  Problem Relation Age of Onset   Cancer Father     Social History   Socioeconomic History   Marital status: Divorced    Spouse name: Not on file   Number of children: 2   Years of education: 14   Highest education level: Associate degree: academic program  Occupational History   Occupation: Psychiatric nurse    Comment: retired  Tobacco Use   Smoking status: Former    Packs/day: 1.00    Years: 30.00    Pack years: 30.00    Types: Cigarettes    Quit date: 2015    Years since quitting:  7.8   Smokeless tobacco: Never   Tobacco comments:    Quit  Vaping Use   Vaping Use: Every day   Substances: Nicotine, Flavoring  Substance and Sexual Activity   Alcohol use: No   Drug use: No   Sexual activity: Not Currently  Other Topics Concern   Not on file  Social History Narrative   Run errands   Watch TV   Social Determinants of Health   Financial Resource Strain: Not on file  Food Insecurity: Not on file  Transportation Needs: Not on file  Physical Activity: Not on file  Stress: Not on file  Social Connections: Not on file  Intimate Partner Violence: Not on file    Outpatient Medications Prior to Visit  Medication Sig Dispense Refill   Adalimumab 40 MG/0.4ML PSKT Inject 0.4 mLs into the skin every 7 (seven) days.     ARIPiprazole (ABILIFY) 5 MG tablet Take 1 tablet (5 mg total) by mouth daily. 90 tablet 3   aspirin EC 81 MG tablet Take 1 tablet (81 mg total) by mouth  daily. Swallow whole. 90 tablet 3   atorvastatin (LIPITOR) 80 MG tablet Take 1 tablet (80 mg total) by mouth daily. 90 tablet 3   b complex vitamins tablet Take 1 tablet by mouth daily.     buPROPion (WELLBUTRIN XL) 300 MG 24 hr tablet TAKE 1 TABLET EVERY DAY 90 tablet 3   carisoprodol (SOMA) 350 MG tablet TAKE 1 TABLET THREE TIMES DAILY AS NEEDED 270 tablet 0   escitalopram (LEXAPRO) 20 MG tablet TAKE 1 TABLET EVERY DAY 90 tablet 1   gabapentin (NEURONTIN) 600 MG tablet Take 1 tablet (600 mg total) by mouth 3 (three) times daily. 270 tablet 1   HYDROmorphone (DILAUDID) 4 MG tablet Take 4 mg by mouth every 6 (six) hours as needed for severe pain. Dr. Carnella Guadalajara     hydrOXYzine (ATARAX/VISTARIL) 25 MG tablet TAKE 1 TABLET TWICE DAILY AS NEEDED 180 tablet 1   NARCAN 4 MG/0.1ML LIQD nasal spray kit      traZODone (DESYREL) 100 MG tablet TAKE 2 TABLETS AT BEDTIME 180 tablet 1   No facility-administered medications prior to visit.    Allergies  Allergen Reactions   Buspirone Other (See Comments)    "wired up, out of body" "wired up, out of body"    Diclofenac Other (See Comments)    Stomach upset Stomach upset    Interferon Beta-1a Other (See Comments)    'bad reaction" 'bad reaction" 'bad reaction"    Venlafaxine Other (See Comments)    "felt like a zombie" "felt like a zombie"    Methocarbamol Other (See Comments)   Saccharin Nausea Only    ROS Review of Systems    Objective:    Physical Exam Constitutional:      Appearance: Normal appearance. He is well-developed.  HENT:     Head: Normocephalic and atraumatic.  Cardiovascular:     Rate and Rhythm: Normal rate and regular rhythm.     Heart sounds: Normal heart sounds.  Pulmonary:     Effort: Pulmonary effort is normal.     Breath sounds: Normal breath sounds.  Skin:    General: Skin is warm and dry.  Neurological:     Mental Status: He is alert and oriented to person, place, and time. Mental status is at  baseline.  Psychiatric:        Behavior: Behavior normal.    BP (!) 103/48   Pulse Marland Kitchen)  59   Ht _0  (1.778 m)   Wt 198 lb (89.8 kg)   SpO2 99%   BMI 28.41 kg/m  Wt Readings from Last 3 Encounters:  03/09/21 198 lb (89.8 kg)  03/01/21 195 lb 12.8 oz (88.8 kg)  12/01/20 183 lb 6.4 oz (83.2 kg)     Health Maintenance Due  Topic Date Due   Hepatitis C Screening  Never done   Zoster Vaccines- Shingrix (2 of 2) 12/12/2019   COVID-19 Vaccine (4 - Booster for Moderna series) 05/18/2020    There are no preventive care reminders to display for this patient.  No results found for: TSH Lab Results  Component Value Date   WBC 7.3 01/11/2021   HGB 14.5 01/11/2021   HCT 44.5 01/11/2021   MCV 94.9 01/11/2021   PLT 249 01/11/2021   Lab Results  Component Value Date   NA 140 01/11/2021   K 4.2 01/11/2021   CO2 34 (H) 01/11/2021   GLUCOSE 104 (H) 01/11/2021   BUN 10 01/11/2021   CREATININE 0.88 01/11/2021   BILITOT 0.3 01/11/2021   AST 17 01/11/2021   ALT 11 01/11/2021   PROT 6.8 01/11/2021   CALCIUM 9.0 01/11/2021   EGFR 93 01/11/2021   Lab Results  Component Value Date   CHOL 173 01/11/2021   Lab Results  Component Value Date   HDL 51 01/11/2021   Lab Results  Component Value Date   LDLCALC 105 (H) 01/11/2021   Lab Results  Component Value Date   TRIG 82 01/11/2021   Lab Results  Component Value Date   CHOLHDL 3.4 01/11/2021   No results found for: HGBA1C    Assessment & Plan:   Problem List Items Addressed This Visit       Cardiovascular and Mediastinum   Atherosclerosis of aorta ([place])    We did discuss the need for the increased dose on his statin.  He currently does not really exercise.  We did discuss that he could start working out regularly lose about 10 or 15 pounds which I think would be helpful and we could go from there he really wants to be able to come down on his dose may be to 40 mg instead of 80 mg I think if he puts in some major  lifestyle changes and is able to keep his LDL down less than 70 that might be a reasonable option.      Relevant Medications   sildenafil (VIAGRA) 100 MG tablet     Other   Tobacco abuse    Discussed options he would like to try Chantix again.  Prescription sent to mail order.  He is tolerated it well in the past.      Relevant Medications   varenicline (CHANTIX) 1 MG tablet   ED (erectile dysfunction)    Prescription sent for Viagra 100 mg.      Chronic bilateral low back pain    Follows with pain management we discussed again the importance of eventually transitioning off of the Soma he is tried multiple muscle relaxers in the past and typically has side effects often GI related.  For now he is can work on decreasing [employer] down to twice a day.      Other Visit Diagnoses     Need for immunization against influenza    -  Primary   Relevant Orders   Flu Vaccine QUAD High Dose(Fluad) (Completed)   Vaping nicotine dependence, tobacco product  Relevant Medications   varenicline (CHANTIX) 1 MG tablet       Meds ordered this encounter  Medications   sildenafil (VIAGRA) 100 MG tablet    Sig: Take 0.5-1 tablets (50-100 mg total) by mouth daily as needed for erectile dysfunction.    Dispense:  15 tablet    Refill:  3   varenicline (CHANTIX) 1 MG tablet    Sig: Take 0.5 tablets (0.5 mg total) by mouth daily for 3 days, THEN 0.5 tablets (0.5 mg total) 2 (two) times daily for 4 days, THEN 1 tablet (1 mg total) 2 (two) times daily.    Dispense:  180 tablet    Refill:  1     Follow-up: No follow-ups on file.    Beatrice Lecher, MD

## 2021-03-09 NOTE — Assessment & Plan Note (Signed)
Discussed options he would like to try Chantix again.  Prescription sent to mail order.  He is tolerated it well in the past.

## 2021-03-09 NOTE — Assessment & Plan Note (Signed)
Follows with pain management we discussed again the importance of eventually transitioning off of the Soma he is tried multiple muscle relaxers in the past and typically has side effects often GI related.  For now he is can work on decreasing [employer] down to twice a day.

## 2021-03-10 ENCOUNTER — Ambulatory Visit (HOSPITAL_COMMUNITY): Payer: Medicare Other | Attending: Cardiovascular Disease

## 2021-03-10 DIAGNOSIS — I2510 Atherosclerotic heart disease of native coronary artery without angina pectoris: Secondary | ICD-10-CM

## 2021-03-10 DIAGNOSIS — E7800 Pure hypercholesterolemia, unspecified: Secondary | ICD-10-CM | POA: Diagnosis present

## 2021-03-10 LAB — MYOCARDIAL PERFUSION IMAGING
Base ST Depression (mm): 0 mm
LV dias vol: 97 mL (ref 62–150)
LV sys vol: 46 mL
Nuc Stress EF: 52 %
Peak HR: 69 {beats}/min
Rest HR: 58 {beats}/min
Rest Nuclear Isotope Dose: 10.3 mCi
SDS: 0
SRS: 0
SSS: 0
ST Depression (mm): 0 mm
Stress Nuclear Isotope Dose: 31.7 mCi
TID: 1.14

## 2021-03-10 MED ORDER — REGADENOSON 0.4 MG/5ML IV SOLN
0.4000 mg | Freq: Once | INTRAVENOUS | Status: AC
  Administered 2021-03-10: 0.4 mg via INTRAVENOUS

## 2021-03-10 MED ORDER — TECHNETIUM TC 99M TETROFOSMIN IV KIT
10.3000 | PACK | Freq: Once | INTRAVENOUS | Status: AC | PRN
  Administered 2021-03-10: 10.3 via INTRAVENOUS
  Filled 2021-03-10: qty 11

## 2021-03-10 MED ORDER — TECHNETIUM TC 99M TETROFOSMIN IV KIT
31.7000 | PACK | Freq: Once | INTRAVENOUS | Status: AC | PRN
  Administered 2021-03-10: 31.7 via INTRAVENOUS
  Filled 2021-03-10: qty 32

## 2021-03-29 ENCOUNTER — Telehealth: Payer: Self-pay | Admitting: Family Medicine

## 2021-03-29 DIAGNOSIS — Z720 Tobacco use: Secondary | ICD-10-CM

## 2021-03-29 DIAGNOSIS — F17290 Nicotine dependence, other tobacco product, uncomplicated: Secondary | ICD-10-CM

## 2021-03-29 MED ORDER — VARENICLINE TARTRATE 1 MG PO TABS: ORAL_TABLET | ORAL | 1 refills | Status: DC

## 2021-03-29 NOTE — Telephone Encounter (Signed)
Pt called asking if script for Chantix could be sent to [facility] on [address]. It has been sent to his mail order pharmacy, but it is too costly. When he inquired about the pricing, he states that the mail order pharmacy didn't seem to know what they were doing.

## 2021-03-30 NOTE — Telephone Encounter (Signed)
RX sent to [facility] on 03/29/2021.  Tiajuana Amass, CMA

## 2021-04-23 ENCOUNTER — Encounter: Payer: Self-pay | Admitting: Family Medicine

## 2021-04-23 ENCOUNTER — Ambulatory Visit (INDEPENDENT_AMBULATORY_CARE_PROVIDER_SITE_OTHER): Payer: Medicare Other

## 2021-04-23 ENCOUNTER — Ambulatory Visit (INDEPENDENT_AMBULATORY_CARE_PROVIDER_SITE_OTHER): Payer: Medicare Other | Admitting: Family Medicine

## 2021-04-23 ENCOUNTER — Other Ambulatory Visit: Payer: Self-pay

## 2021-04-23 VITALS — BP 152/73 | HR 66 | Temp 98.2°F | Resp 16 | Ht 70.0 in | Wt 198.0 lb

## 2021-04-23 DIAGNOSIS — J029 Acute pharyngitis, unspecified: Secondary | ICD-10-CM

## 2021-04-23 DIAGNOSIS — R051 Acute cough: Secondary | ICD-10-CM

## 2021-04-23 DIAGNOSIS — I2510 Atherosclerotic heart disease of native coronary artery without angina pectoris: Secondary | ICD-10-CM | POA: Diagnosis not present

## 2021-04-23 DIAGNOSIS — R0789 Other chest pain: Secondary | ICD-10-CM

## 2021-04-23 MED ORDER — BENZONATATE 200 MG PO CAPS: 200.0000 mg | ORAL_CAPSULE | Freq: Two times a day (BID) | ORAL | 0 refills | Status: DC | PRN

## 2021-04-23 NOTE — Progress Notes (Signed)
Acute Office Visit  Subjective:    Patient ID: Paul Fowler, male    DOB: Dec 08, 1950, 70 y.o.   MRN: [id number]  Chief Complaint  Patient presents with   Cough    Chest pressure, fever, shortness of breath, weak, loss of appetite, 1 1/2 week. Covid test negative 1 week ago. Patient has also noticed short term memory loss since symptoms    HPI Patient is in today for acute illness. For 1.5 weeks.  He says it started with a sore throat and a fever around 100.6.  He said he did a home COVID test and it was negative.  He did started developing a cough.  He says the cough is a little bit sporadic and its more dry than wet.  In the last couple of days he has felt like there is  an elephant sitting on his chest and feels tired and fever.  No back pain.  He says he has not had a fever since the beginning of the illness.  Mild sore throat but no ear pain.  No GI symptoms such as diarrhea.  He has been mostly using zinc and Tylenol.  He would like something for the cough.  He also wanted to let me know that his ex partner came and checked on him a few days ago and he says because he was not really eating much that she took his hydromorphone away from him.  He wonders if some of his symptoms could even be withdrawal related.  He did follow back up with pain management earlier this morning.  Negative for COVID.   Past Medical History:  Diagnosis Date   Arthritis    Crohn's disease ([facility])    GERD (gastroesophageal reflux disease)    occasionally has indigestion   Hyperlipidemia    Multiple sclerosis ([facility])    DX 3-4 YRS AGO-- ASYMPTOMATIC   Neuromuscular disorder ([facility])    MS  ON NO MEDS    Ulcerative colitis ([facility])    LAST FLAREUP SPRING 2014    Past Surgical History:  Procedure Laterality Date   BACK SURGERY     CERVICAL SPINE SURGERY     FUSION   ELBOW SURGERY     LEFT   MAXIMUM ACCESS (MAS)POSTERIOR LUMBAR INTERBODY FUSION (PLIF) 1 LEVEL N/A 07/11/2013   Procedure: FOR MAXIMUM ACCESS  (MAS) POSTERIOR LUMBAR INTERBODY FUSION (PLIF) 1 LEVEL LUMBAR FOUR-FIVE;  Surgeon: Tia Alert, MD;  Location: [facility];  Service: Neurosurgery;  Laterality: N/A;   THUMB ARTHROSCOPY     LEFT   TONSILLECTOMY     TORN ROTATOR     LEFT SHOULDER    Family History  Problem Relation Age of Onset   Cancer Father     Social History   Socioeconomic History   Marital status: Divorced    Spouse name: Not on file   Number of children: 2   Years of education: 14   Highest education level: Associate degree: academic program  Occupational History   Occupation: Journalist, newspaper    Comment: retired  Tobacco Use   Smoking status: Former    Packs/day: 1.00    Years: 30.00    Pack years: 30.00    Types: Cigarettes    Quit date: 2015    Years since quitting: 7.9   Smokeless tobacco: Former   Tobacco comments:    Quit vaping x 2 weeks   Vaping Use   Vaping Use: Every day   Substances:  Nicotine, Flavoring  Substance and Sexual Activity   Alcohol use: No   Drug use: No   Sexual activity: Not Currently  Other Topics Concern   Not on file  Social History Narrative   Run errands   Watch TV   Social Determinants of Health   Financial Resource Strain: Not on file  Food Insecurity: Not on file  Transportation Needs: Not on file  Physical Activity: Not on file  Stress: Not on file  Social Connections: Not on file  Intimate Partner Violence: Not on file    Outpatient Medications Prior to Visit  Medication Sig Dispense Refill   Adalimumab 40 MG/0.4ML PSKT Inject 0.4 mLs into the skin every 7 (seven) days.     ARIPiprazole (ABILIFY) 5 MG tablet Take 1 tablet (5 mg total) by mouth daily. 90 tablet 3   aspirin EC 81 MG tablet Take 1 tablet (81 mg total) by mouth daily. Swallow whole. 90 tablet 3   atorvastatin (LIPITOR) 80 MG tablet Take 1 tablet (80 mg total) by mouth daily. 90 tablet 3   b complex vitamins tablet Take 1 tablet by mouth daily.     buPROPion  (WELLBUTRIN XL) 300 MG 24 hr tablet TAKE 1 TABLET EVERY DAY 90 tablet 3   carisoprodol (SOMA) 350 MG tablet TAKE 1 TABLET THREE TIMES DAILY AS NEEDED 270 tablet 0   escitalopram (LEXAPRO) 20 MG tablet TAKE 1 TABLET EVERY DAY 90 tablet 1   gabapentin (NEURONTIN) 600 MG tablet Take 1 tablet (600 mg total) by mouth 3 (three) times daily. 270 tablet 1   HYDROmorphone (DILAUDID) 4 MG tablet Take 4 mg by mouth every 6 (six) hours as needed for severe pain. Dr. Aggie Moats     hydrOXYzine (ATARAX/VISTARIL) 25 MG tablet TAKE 1 TABLET TWICE DAILY AS NEEDED 180 tablet 1   NARCAN 4 MG/0.1ML LIQD nasal spray kit      sildenafil (VIAGRA) 100 MG tablet Take 0.5-1 tablets (50-100 mg total) by mouth daily as needed for erectile dysfunction. 15 tablet 3   traZODone (DESYREL) 100 MG tablet TAKE 2 TABLETS AT BEDTIME 180 tablet 1   varenicline (CHANTIX) 1 MG tablet Take 0.5 tablets (0.5 mg total) by mouth daily for 3 days, THEN 0.5 tablets (0.5 mg total) 2 (two) times daily for 4 days, THEN 1 tablet (1 mg total) 2 (two) times daily. 180 tablet 1   No facility-administered medications prior to visit.    Allergies  Allergen Reactions   Buspirone Other (See Comments)    "wired up, out of body" "wired up, out of body"    Diclofenac Other (See Comments)    Stomach upset Stomach upset    Interferon Beta-1a Other (See Comments)    'bad reaction" 'bad reaction" 'bad reaction"    Venlafaxine Other (See Comments)    "felt like a zombie" "felt like a zombie"    Methocarbamol Other (See Comments)   Saccharin Nausea Only    Review of Systems     Objective:    Physical Exam Constitutional:      Appearance: He is well-developed.  HENT:     Head: Normocephalic and atraumatic.     Right Ear: External ear normal.     Left Ear: External ear normal.     Nose: Nose normal.  Eyes:     Conjunctiva/sclera: Conjunctivae normal.     Pupils: Pupils are equal, round, and reactive to light.  Neck:     Thyroid: No  thyromegaly.  Cardiovascular:     Rate and Rhythm: Normal rate.     Heart sounds: Normal heart sounds.  Pulmonary:     Effort: Pulmonary effort is normal.     Breath sounds: Normal breath sounds.  Musculoskeletal:     Cervical back: Neck supple.  Lymphadenopathy:     Cervical: No cervical adenopathy.  Skin:    General: Skin is warm and dry.  Neurological:     Mental Status: He is alert and oriented to person, place, and time.    BP (!) 152/73   Pulse 66   Temp 98.2 F (36.8 C)   Resp 16   Ht $R'5\' 10"'Bi$  (1.778 m)   Wt 198 lb (89.8 kg)   SpO2 98%   BMI 28.41 kg/m  Wt Readings from Last 3 Encounters:  04/23/21 198 lb (89.8 kg)  03/10/21 195 lb (88.5 kg)  03/09/21 198 lb (89.8 kg)    Health Maintenance Due  Topic Date Due   Hepatitis C Screening  Never done    There are no preventive care reminders to display for this patient.   No results found for: TSH Lab Results  Component Value Date   WBC 7.3 01/11/2021   HGB 14.5 01/11/2021   HCT 44.5 01/11/2021   MCV 94.9 01/11/2021   PLT 249 01/11/2021   Lab Results  Component Value Date   NA 140 01/11/2021   K 4.2 01/11/2021   CO2 34 (H) 01/11/2021   GLUCOSE 104 (H) 01/11/2021   BUN 10 01/11/2021   CREATININE 0.88 01/11/2021   BILITOT 0.3 01/11/2021   AST 17 01/11/2021   ALT 11 01/11/2021   PROT 6.8 01/11/2021   CALCIUM 9.0 01/11/2021   EGFR 93 01/11/2021   Lab Results  Component Value Date   CHOL 173 01/11/2021   Lab Results  Component Value Date   HDL 51 01/11/2021   Lab Results  Component Value Date   LDLCALC 105 (H) 01/11/2021   Lab Results  Component Value Date   TRIG 82 01/11/2021   Lab Results  Component Value Date   CHOLHDL 3.4 01/11/2021   No results found for: HGBA1C     Assessment & Plan:   Problem List Items Addressed This Visit   None Visit Diagnoses     Acute cough    -  Primary   Relevant Orders   CBC with Differential/Platelet   DG Chest 2 View   BASIC METABOLIC PANEL  WITH GFR   Chest pressure       Sore throat          Cough with chest pressure-lung exam is clear today and oxygen is normal but I am concerned typically at a week and a half most people are starting to feel better with typical viral illness.  That withdrawal from his pain medication of the last couple days to could certainly be contributing as well so we will get some labs including a CBC and check electrolytes since he does look a little dehydrated as well as get a chest x-ray just to rule out underlying pneumonia.  In the meantime sent over prescription for [facility].  We will call with results once available.  Chest pressure-see note above-he actually recently had a normal cardiac work-up.  Meds ordered this encounter  Medications   benzonatate (TESSALON) 200 MG capsule    Sig: Take 1 capsule (200 mg total) by mouth 2 (two) times daily as needed for cough.    Dispense:  20 capsule    Refill:  0      Beatrice Lecher, MD

## 2021-04-24 LAB — BASIC METABOLIC PANEL WITH GFR
BUN: 7 mg/dL (ref 7–25)
CO2: 27 mmol/L (ref 20–32)
Calcium: 9 mg/dL (ref 8.6–10.3)
Chloride: 93 mmol/L — ABNORMAL LOW (ref 98–110)
Creat: 0.74 mg/dL (ref 0.70–1.35)
Glucose, Bld: 107 mg/dL — ABNORMAL HIGH (ref 65–99)
Potassium: 4.9 mmol/L (ref 3.5–5.3)
Sodium: 131 mmol/L — ABNORMAL LOW (ref 135–146)
eGFR: 98 mL/min/{1.73_m2} (ref >=60)

## 2021-04-24 LAB — CBC WITH DIFFERENTIAL/PLATELET
Absolute Monocytes: 858 cells/uL (ref 200–950)
Basophils Absolute: 35 cells/uL (ref 0–200)
Basophils Relative: 0.3 %
Eosinophils Absolute: 278 cells/uL (ref 15–500)
Eosinophils Relative: 2.4 %
HCT: 38.2 % — ABNORMAL LOW (ref 38.5–50.0)
Hemoglobin: 13.3 g/dL (ref 13.2–17.1)
Lymphs Abs: 1380 cells/uL (ref 850–3900)
MCH: 31.4 pg (ref 27.0–33.0)
MCHC: 34.8 g/dL (ref 32.0–36.0)
MCV: 90.3 fL (ref 80.0–100.0)
MPV: 10.1 fL (ref 7.5–12.5)
Monocytes Relative: 7.4 %
Neutro Abs: 9048 cells/uL — ABNORMAL HIGH (ref 1500–7800)
Neutrophils Relative %: 78 %
Platelets: 254 10*3/uL (ref 140–400)
RBC: 4.23 10*6/uL (ref 4.20–5.80)
RDW: 12.3 % (ref 11.0–15.0)
Total Lymphocyte: 11.9 %
WBC: 11.6 10*3/uL — ABNORMAL HIGH (ref 3.8–10.8)

## 2021-04-26 ENCOUNTER — Telehealth: Payer: Self-pay | Admitting: *Deleted

## 2021-04-26 ENCOUNTER — Other Ambulatory Visit: Payer: Self-pay | Admitting: *Deleted

## 2021-04-26 ENCOUNTER — Other Ambulatory Visit: Payer: Self-pay | Admitting: Family Medicine

## 2021-04-26 DIAGNOSIS — E871 Hypo-osmolality and hyponatremia: Secondary | ICD-10-CM

## 2021-04-26 DIAGNOSIS — G8929 Other chronic pain: Secondary | ICD-10-CM

## 2021-04-26 DIAGNOSIS — M545 Low back pain, unspecified: Secondary | ICD-10-CM

## 2021-04-26 DIAGNOSIS — M4802 Spinal stenosis, cervical region: Secondary | ICD-10-CM

## 2021-04-26 DIAGNOSIS — Z981 Arthrodesis status: Secondary | ICD-10-CM

## 2021-04-26 MED ORDER — CARISOPRODOL 350 MG PO TABS: 350.0000 mg | ORAL_TABLET | Freq: Two times a day (BID) | ORAL | 0 refills | Status: DC | PRN

## 2021-04-26 NOTE — Progress Notes (Signed)
Hi Paul Fowler, your white blood cell counts just slightly elevated indicating that your body is fighting some type of infection.  Typically if it just a mild elevation it is usually a virus.  Since not in a worrisome range.  Her sodium is a little low which is unusual for you normally its in the normal range.  I like to recheck that in a couple of weeks with a BMP.

## 2021-04-26 NOTE — Progress Notes (Signed)
We will taper down the Soma from 3 times daily down to twice daily.

## 2021-04-26 NOTE — Telephone Encounter (Signed)
Pt's ex-wife called and wanted to report some things that had been going on with the patient.  She stated that he is an addict (alcohol and prescription medications) and that she had been contacted by the neighbors because he had mail and papers piling up outside of the home and they reached out to her to see if she had access to the home. She directed them to where his bedroom window was and told them to knock on it. She stated that they did do this however, he did not answer the door. She mentioned something about the [facility] being contacted but unsure if they came out.   She informed me that she did take some food to him and he was shaking so badly that he could hardly get the sandwich or his drink to his mouth. She said that he was given #120 pills of the Dilaudid 4 mg from the pain clinic and he had 10-12 pills left but he was supposed to have 40. She said that she took a video of him shaking while he was eating.   She got a text from him accusing her of taking his medications. She said that she did not take any of his medications from him.  She is asking that Dr. Linford Arnold not inform him that she has called to inform her of what's going on.

## 2021-04-26 NOTE — Progress Notes (Signed)
Hi Paul Fowler, on your chest x-ray just showed a little bit of what is called atelectasis which is a compression of the lung tissue.  This can actually be a fairly common finding.  But in the setting of your recent cough and chest pressure it would be reasonable to consider a trial of antibiotics as this could be the beginning of a pneumonia.  Let us know how you are feeling.  If you are feeling a little better then I think we can just watch and wait.  If you do not feel better then let us know and I will send over something to the pharmacy.

## 2021-04-27 NOTE — Telephone Encounter (Signed)
Please call pt:  Will dec Soma in Jan when due for refill. If he feels he needs it TID then he can discuss with his pain management doc.

## 2021-04-28 NOTE — Telephone Encounter (Signed)
Pt informed.  Pt expressed understanding and is agreeable.  T. Destanie Tibbetts, CMA  

## 2021-05-12 LAB — HEPATIC FUNCTION PANEL
AG Ratio: 1.6 (calc) (ref 1.0–2.5)
ALT: 22 U/L (ref 9–46)
AST: 21 U/L (ref 10–35)
Albumin: 4.2 g/dL (ref 3.6–5.1)
Alkaline phosphatase (APISO): 89 U/L (ref 35–144)
Bilirubin, Direct: 0.1 mg/dL (ref 0.0–0.2)
Globulin: 2.7 g/dL (calc) (ref 1.9–3.7)
Indirect Bilirubin: 0.5 mg/dL (calc) (ref 0.2–1.2)
Total Bilirubin: 0.6 mg/dL (ref 0.2–1.2)
Total Protein: 6.9 g/dL (ref 6.1–8.1)

## 2021-05-12 LAB — BASIC METABOLIC PANEL
BUN: 8 mg/dL (ref 7–25)
CO2: 31 mmol/L (ref 20–32)
Calcium: 9.6 mg/dL (ref 8.6–10.3)
Chloride: 102 mmol/L (ref 98–110)
Creat: 1.06 mg/dL (ref 0.70–1.35)
Glucose, Bld: 102 mg/dL — ABNORMAL HIGH (ref 65–99)
Potassium: 4.5 mmol/L (ref 3.5–5.3)
Sodium: 140 mmol/L (ref 135–146)

## 2021-05-12 LAB — LIPID PANEL
Cholesterol: 119 mg/dL (ref <200)
HDL: 42 mg/dL (ref >=40)
LDL Cholesterol (Calc): 61 mg/dL (calc)
Non-HDL Cholesterol (Calc): 77 mg/dL (calc) (ref <130)
Total CHOL/HDL Ratio: 2.8 (calc) (ref <5.0)
Triglycerides: 75 mg/dL (ref <150)

## 2021-05-12 NOTE — Progress Notes (Signed)
Your lab work is within acceptable range and there are no concerning findings.   ?

## 2021-06-03 ENCOUNTER — Other Ambulatory Visit: Payer: Self-pay

## 2021-06-03 ENCOUNTER — Ambulatory Visit (INDEPENDENT_AMBULATORY_CARE_PROVIDER_SITE_OTHER): Payer: Medicare Other | Admitting: Family Medicine

## 2021-06-03 ENCOUNTER — Encounter: Payer: Self-pay | Admitting: Family Medicine

## 2021-06-03 VITALS — BP 124/65 | HR 67 | Ht 70.0 in | Wt 188.0 lb

## 2021-06-03 DIAGNOSIS — M545 Low back pain, unspecified: Secondary | ICD-10-CM | POA: Diagnosis not present

## 2021-06-03 DIAGNOSIS — M542 Cervicalgia: Secondary | ICD-10-CM

## 2021-06-03 DIAGNOSIS — M4802 Spinal stenosis, cervical region: Secondary | ICD-10-CM | POA: Diagnosis not present

## 2021-06-03 DIAGNOSIS — J432 Centrilobular emphysema: Secondary | ICD-10-CM

## 2021-06-03 DIAGNOSIS — G8929 Other chronic pain: Secondary | ICD-10-CM

## 2021-06-03 DIAGNOSIS — F419 Anxiety disorder, unspecified: Secondary | ICD-10-CM

## 2021-06-03 DIAGNOSIS — F339 Major depressive disorder, recurrent, unspecified: Secondary | ICD-10-CM

## 2021-06-03 NOTE — Assessment & Plan Note (Signed)
We will place in a pain management referral to [facility] per Dr. Migdalia Dk recommendations.  She is covering his medications for the next 90 days
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
## 2021-06-03 NOTE — Assessment & Plan Note (Signed)
Stable.  Doing well on current regimen.  No adjustments made today.  Follow-up in 6 months.

## 2021-06-03 NOTE — Assessment & Plan Note (Signed)
Stable.  He is feeling much better since his respiratory illness in December.

## 2021-06-18 ENCOUNTER — Ambulatory Visit (INDEPENDENT_AMBULATORY_CARE_PROVIDER_SITE_OTHER): Payer: Medicare Other | Admitting: Family Medicine

## 2021-06-18 ENCOUNTER — Other Ambulatory Visit: Payer: Self-pay

## 2021-06-18 DIAGNOSIS — Z0000 Encounter for general adult medical examination without abnormal findings: Secondary | ICD-10-CM

## 2021-06-18 NOTE — Progress Notes (Signed)
MEDICARE ANNUAL WELLNESS VISIT  06/18/2021  Telephone Visit Disclaimer This Medicare AWV was conducted by telephone due to national recommendations for restrictions regarding the COVID-19 Pandemic (e.g. social distancing).  I verified, using two identifiers, that I am speaking with Paul Fowler or their authorized healthcare agent. I discussed the limitations, risks, security, and privacy concerns of performing an evaluation and management service by telephone and the potential availability of an in-person appointment in the future. The patient expressed understanding and agreed to proceed.  Location of Patient: Home Location of Provider (nurse):  In the office.  Subjective:    Paul Fowler is a 71 y.o. male patient of Metheney, Rene Kocher, MD who had a Medicare Annual Wellness Visit today via telephone. Paul Fowler is Retired and lives alone. he has 2 children. he reports that he is socially active and does interact with friends/family regularly. he is minimally physically active and enjoys playing drums and ride his motorcycle. .  Patient Care Team: Hali Marry, MD as PCP - General (Family Medicine) Darlis Loan, MD as Referring Physician (Gastroenterology)  Advanced Directives 06/18/2021 02/03/2020 01/28/2019 09/30/2018 05/02/2017 07/11/2013 07/08/2013  Does Patient Have a Medical Advance Directive? _0  Patient has advance directive, copy not in chart Patient has advance directive, copy not in chart  Type of Advance Directive Living will;Healthcare Power of [place] will [place];Living will Living will [place];Living will Living will -  Does patient want to make changes to medical advance directive? - No - Patient declined No - Patient declined No - Patient declined - No change requested -  Copy of [place] in Chart? No - copy requested - No - copy requested - No - copy requested - [facility] Utilization Over the Past 12 Months: # of hospitalizations or ER visits: 0 # of surgeries: 0  Review of Systems    Patient reports that his overall health is unchanged compared to last year.  History obtained from chart review and the patient  Patient Reported Readings (BP, Pulse, CBG, Weight, etc) none  Pain Assessment Pain : 0-10 Pain Score: 6  Pain Type: Chronic pain Pain Location: Back Pain Orientation: Upper Pain Descriptors / Indicators: Aching Pain Onset: More than a month ago Pain Frequency: Constant Pain Relieving Factors: medication  Pain Relieving Factors: medication  Current Medications & Allergies (verified) Allergies as of 06/18/2021       Reactions   Buspirone Other (See Comments)   "wired up, out of body" "wired up, out of body"   Diclofenac Other (See Comments)   Stomach upset Stomach upset   Interferon Beta-1a Other (See Comments)   'bad reaction" 'bad reaction" 'bad reaction"   Venlafaxine Other (See Comments)   "felt like a zombie" "felt like a zombie"   Methocarbamol Other (See Comments)   Saccharin Nausea Only        Medication List        Accurate as of June 18, 2021  2:31 PM. If you have any questions, ask your nurse or doctor.          Adalimumab 40 MG/0.4ML Pskt Inject 0.4 mLs into the skin every 7 (seven) days.   ARIPiprazole 5 MG tablet Commonly known as: ABILIFY Take 1 tablet (5 mg total) by mouth daily.   aspirin EC 81 MG tablet Take 1 tablet (81 mg total) by mouth daily. Swallow whole.   atorvastatin 80  MG tablet Commonly known as: LIPITOR Take 1 tablet (80 mg total) by mouth daily.   b complex vitamins tablet Take 1 tablet by mouth daily.   buPROPion 300 MG 24 hr tablet Commonly known as: WELLBUTRIN XL TAKE 1 TABLET EVERY DAY   carisoprodol 350 MG tablet Commonly known as: SOMA Take 1 tablet (350 mg total) by mouth 2 (two) times daily as needed.   escitalopram 20 MG tablet Commonly  known as: LEXAPRO TAKE 1 TABLET EVERY DAY   gabapentin 600 MG tablet Commonly known as: NEURONTIN Take 1 tablet (600 mg total) by mouth 3 (three) times daily.   HYDROmorphone 4 MG tablet Commonly known as: DILAUDID Take 4 mg by mouth every 6 (six) hours as needed for severe pain. Dr. Carnella Guadalajara   hydrOXYzine 25 MG tablet Commonly known as: ATARAX TAKE 1 TABLET TWICE DAILY AS NEEDED   naproxen 500 MG tablet Commonly known as: NAPROSYN Take by mouth.   Narcan 4 MG/0.1ML Liqd nasal spray kit Generic drug: naloxone   sildenafil 100 MG tablet Commonly known as: Viagra Take 0.5-1 tablets (50-100 mg total) by mouth daily as needed for erectile dysfunction.   traZODone 100 MG tablet Commonly known as: DESYREL TAKE 2 TABLETS AT BEDTIME        History (reviewed): Past Medical History:  Diagnosis Date   Allergy    See chart   Arthritis    Crohn's disease ([place])    GERD (gastroesophageal reflux disease)    occasionally has indigestion   Hyperlipidemia    Multiple sclerosis ([facility])    DX 3-4 YRS AGO-- ASYMPTOMATIC   Neuromuscular disorder ([place])    MS  ON NO MEDS    Ulcerative colitis ([place])    LAST FLAREUP SPRING 2014   Past Surgical History:  Procedure Laterality Date   BACK SURGERY     CERVICAL SPINE SURGERY     FUSION   ELBOW SURGERY     LEFT   MAXIMUM ACCESS (MAS)POSTERIOR LUMBAR INTERBODY FUSION (PLIF) 1 LEVEL N/A 07/11/2013   Procedure: FOR MAXIMUM ACCESS (MAS) POSTERIOR LUMBAR INTERBODY FUSION (PLIF) 1 LEVEL LUMBAR FOUR-FIVE;  Surgeon: Eustace Moore, MD;  Location: [place];  Service: Neurosurgery;  Laterality: N/A;   SPINE SURGERY     Approximately 10 years ago   THUMB ARTHROSCOPY     LEFT   TONSILLECTOMY     TORN ROTATOR     LEFT SHOULDER   Family History  Problem Relation Age of Onset   Arthritis Mother    Cancer Father    Arthritis Father    Social History   Socioeconomic History   Marital status: Divorced    Spouse name: Not on file    Number of children: 2   Years of education: 14   Highest education level: Associate degree: academic program  Occupational History   Occupation: Psychiatric nurse    Comment: retired  Tobacco Use   Smoking status: Former    Packs/day: 1.00    Years: 30.00    Pack years: 30.00    Types: Cigarettes, E-cigarettes    Quit date: 05/16/2013    Years since quitting: 8.0   Smokeless tobacco: Former   Tobacco comments:    Quit vaping x 2 weeks   Vaping Use   Vaping Use: Every day   Substances: Nicotine, Flavoring  Substance and Sexual Activity   Alcohol use: Not Currently   Drug use: Never   Sexual activity: Not Currently  Birth control/protection: Abstinence  Other Topics Concern   Not on file  Social History Narrative   Lives alone. He has 2 children. He enjoys playing drums and ride his motorcycle.    Social Determinants of Health   Financial Resource Strain: Low Risk    Difficulty of Paying Living Expenses: Not hard at all  Food Insecurity: No Food Insecurity   Worried About Charity fundraiser in the Last Year: Never true   [place] in the Last Year: Never true  Transportation Needs: No Transportation Needs   Lack of Transportation (Medical): No   Lack of Transportation (Non-Medical): No  Physical Activity: Inactive   Days of Exercise per Week: 0 days   Minutes of Exercise per Session: 0 min  Stress: No Stress Concern Present   Feeling of Stress : Not at all  Social Connections: Moderately Integrated   Frequency of Communication with Friends and Family: Three times a week   Frequency of Social Gatherings with Friends and Family: Once a week   Attends Religious Services: More than 4 times per year   Active Member of [employer] or Organizations: Yes   Attends Archivist Meetings: More than 4 times per year   Marital Status: Divorced    Activities of Daily Living In your present state of health, do you have any difficulty performing the following  activities: 06/18/2021 06/14/2021  Hearing? N N  Vision? N N  Difficulty concentrating or making decisions? N N  Walking or climbing stairs? N N  Dressing or bathing? N N  Doing errands, shopping? N N  Preparing Food and eating ? N N  Using the Toilet? N N  In the past six months, have you accidently leaked urine? N N  Do you have problems with loss of bowel control? N N  Managing your Medications? N N  Managing your Finances? N N  Housekeeping or managing your Housekeeping? N N  Some recent data might be hidden    Patient Education/ Literacy How often do you need to have someone help you when you read instructions, pamphlets, or other written materials from your doctor or pharmacy?: 1 - Never What is the last grade level you completed in school?: Associates degree  Exercise Current Exercise Habits: The patient does not participate in regular exercise at present, Exercise limited by: None identified  Diet Patient reports consuming 3 meals a day and 0 snack(s) a day Patient reports that his primary diet is: Regular Patient reports that she does have regular access to food.   Depression Screen PHQ 2/9 Scores 06/18/2021 06/03/2021 03/09/2021 12/01/2020 06/03/2020 06/03/2020 02/03/2020  PHQ - 2 Score 0 0 0 0 1 1 0  PHQ- 9 Score 0 0 0 0 1 - -     Fall Risk Fall Risk  06/18/2021 06/14/2021 06/03/2021 03/09/2021 02/03/2020  Falls in the past year? 0 0 0 0 0  Number falls in past yr: 0 - 0 0 0  Injury with Fall? 0 - 0 0 0  Risk for fall due to : No Fall Risks - No Fall Risks No Fall Risks -  Follow up Falls evaluation completed;Education provided - Falls evaluation completed Falls prevention discussed;Falls evaluation completed Falls evaluation completed     Objective:  Paul Fowler seemed alert and oriented and he participated appropriately during our telephone visit.  Blood Pressure Weight BMI  BP Readings from Last 3 Encounters:  06/03/21 124/65  04/23/21 (!) 152/73  03/09/21 Marland Kitchen)  103/48   Wt Readings from Last 3 Encounters:  06/03/21 188 lb (85.3 kg)  04/23/21 198 lb (89.8 kg)  03/10/21 195 lb (88.5 kg)   BMI Readings from Last 1 Encounters:  06/03/21 26.98 kg/m    *Unable to obtain current vital signs, weight, and BMI due to telephone visit type  Hearing/Vision  Paul Fowler did not seem to have difficulty with hearing/understanding during the telephone conversation Reports that he has not had a formal eye exam by an eye care professional within the past year Reports that he has not had a formal hearing evaluation within the past year *Unable to fully assess hearing and vision during telephone visit type  Cognitive Function: 6CIT Screen 06/18/2021 02/03/2020 01/28/2019  What Year? 0 points 0 points 0 points  What month? 0 points 0 points 0 points  What time? 0 points 0 points 0 points  Count back from 20 0 points 0 points 0 points  Months in reverse 0 points 0 points 0 points  Repeat phrase 0 points 0 points 0 points  Total Score 0 0 0   (Normal:0-7, Significant for Dysfunction: >8)  Normal Cognitive Function Screening: Yes   Immunization & Health Maintenance Record Immunization History  Administered Date(s) Administered   Fluad Quad(high Dose 65+) 01/18/2019, 03/23/2020, 03/09/2021   Influenza, High Dose Seasonal PF 02/07/2018   Moderna SARS-COV2 Booster Vaccination 05/04/2020, 11/23/2020   Moderna Sars-Covid-2 Vaccination 08/04/2019, 09/04/2019, 03/23/2020   PNEUMOCOCCAL CONJUGATE-20 12/01/2020   PPD Test 02/13/2015   Pneumococcal Conjugate-13 10/17/2019   Td 06/27/2007   Tdap 05/20/2006, 09/06/2016   Zoster Recombinat (Shingrix) 10/17/2019, 11/23/2020    Health Maintenance  Topic Date Due   COVID-19 Vaccine (4 - Booster for Moderna series) 07/04/2021 (Originally 01/18/2021)   Hepatitis C Screening  06/18/2022 (Originally 12/06/1968)   TETANUS/TDAP  09/07/2026   COLONOSCOPY (Pts 45-59yr Insurance coverage will need to be confirmed)  04/09/2028    Pneumonia Vaccine 70 Years old  Completed   INFLUENZA VACCINE  Completed   Zoster Vaccines- Shingrix  Completed   HPV VACCINES  Aged Out       Assessment  This is a routine wellness examination for Paul Fowler  Health Maintenance: Due or Overdue There are no preventive care reminders to display for this patient.   KTacey Heapdoes not need a referral for Community Assistance: Care Management:   no Social Work:    no Prescription Assistance:  no Nutrition/Diabetes Education:  no   Plan:  Personalized Goals  Goals Addressed             This Visit's Progress    Patient Stated       06/18/2021 AWV Goal: Exercise for General Health  Patient will verbalize understanding of the benefits of increased physical activity: Exercising regularly is important. It will improve your overall fitness, flexibility, and endurance. Regular exercise also will improve your overall health. It can help you control your weight, reduce stress, and improve your bone density. Over the next year, patient will increase physical activity as tolerated with a goal of at least 150 minutes of moderate physical activity per week.  You can tell that you are exercising at a moderate intensity if your heart starts beating faster and you start breathing faster but can still hold a conversation. Moderate-intensity exercise ideas include: Walking 1 mile (1.6 km) in about 15 minutes Biking Hiking Golfing Dancing Water aerobics Patient will verbalize understanding of everyday activities that increase physical activity by providing examples  like the following: Yard work, such as: Sales promotion account executive Gardening Washing windows or floors Patient will be able to explain general safety guidelines for exercising:  Before you start a new exercise program, talk with your health care provider. Do not exercise so much that you hurt  yourself, feel dizzy, or get very short of breath. Wear comfortable clothes and wear shoes with good support. Drink plenty of water while you exercise to prevent dehydration or heat stroke. Work out until your breathing and your heartbeat get faster.        Personalized Health Maintenance & Screening Recommendations  Eye exam  Lung Cancer Screening Recommended: yes; has a referral already. (Low Dose CT Chest recommended if Age 44-80 years, 30 pack-year currently smoking OR have quit w/in past 15 years) Hepatitis C Screening recommended: yes HIV Screening recommended: no  Advanced Directives: Written information was not prepared per patient's request.  Referrals & Orders No orders of the defined types were placed in this encounter.   Follow-up Plan Follow-up with Hali Marry, MD as planned Medicare wellness visit in one year. Patient will access AVS on my chart.   I have personally reviewed and noted the following in the patients chart:   Medical and social history Use of alcohol, tobacco or illicit drugs  Current medications and supplements Functional ability and status Nutritional status Physical activity Advanced directives List of other physicians Hospitalizations, surgeries, and ER visits in previous 12 months Vitals Screenings to include cognitive, depression, and falls Referrals and appointments  In addition, I have reviewed and discussed with Paul Fowler certain preventive protocols, quality metrics, and best practice recommendations. A written personalized care plan for preventive services as well as general preventive health recommendations is available and can be mailed to the patient at his request.      Tinnie Gens, RN  06/18/2021

## 2021-06-18 NOTE — Patient Instructions (Addendum)
[place] Maintenance Summary and Written Plan of Care  [name] ,  Thank you for allowing me to perform your Medicare Annual Wellness Visit and for your ongoing commitment to your health.   Health Maintenance & Immunization History Health Maintenance  Topic Date Due   COVID-19 Vaccine (4 - Booster for Moderna series) 07/04/2021 (Originally 01/18/2021)   Hepatitis C Screening  06/18/2022 (Originally 12/06/1968)   TETANUS/TDAP  09/07/2026   COLONOSCOPY (Pts 45-51yrs Insurance coverage will need to be confirmed)  04/09/2028   Pneumonia Vaccine 8+ Years old  Completed   INFLUENZA VACCINE  Completed   Zoster Vaccines- Shingrix  Completed   HPV VACCINES  Aged Out   Immunization History  Administered Date(s) Administered   Fluad Quad(high Dose 65+) 01/18/2019, 03/23/2020, 03/09/2021   Influenza, High Dose Seasonal PF 02/07/2018   Moderna SARS-COV2 Booster Vaccination 05/04/2020, 11/23/2020   Moderna Sars-Covid-2 Vaccination 08/04/2019, 09/04/2019, 03/23/2020   PNEUMOCOCCAL CONJUGATE-20 12/01/2020   PPD Test 02/13/2015   Pneumococcal Conjugate-13 10/17/2019   Td 06/27/2007   Tdap 05/20/2006, 09/06/2016   Zoster Recombinat (Shingrix) 10/17/2019, 11/23/2020    These are the patient goals that we discussed:  Goals Addressed            This Visit's Progress    Patient Stated       06/18/2021 AWV Goal: Exercise for General Health  Patient will verbalize understanding of the benefits of increased physical activity: Exercising regularly is important. It will improve your overall fitness, flexibility, and endurance. Regular exercise also will improve your overall health. It can help you control your weight, reduce stress, and improve your bone density. Over the next year, patient will increase physical activity as tolerated with a goal of at least 150 minutes of moderate physical activity per week.  You can tell that you are exercising at  a moderate intensity if your heart starts beating faster and you start breathing faster but can still hold a conversation. Moderate-intensity exercise ideas include: Walking 1 mile (1.6 km) in about 15 minutes Biking Hiking Golfing Dancing Water aerobics Patient will verbalize understanding of everyday activities that increase physical activity by providing examples like the following: Yard work, such as: Sales promotion account executive Gardening Washing windows or floors Patient will be able to explain general safety guidelines for exercising:  Before you start a new exercise program, talk with your health care provider. Do not exercise so much that you hurt yourself, feel dizzy, or get very short of breath. Wear comfortable clothes and wear shoes with good support. Drink plenty of water while you exercise to prevent dehydration or heat stroke. Work out until your breathing and your heartbeat get faster.         This is a list of Health Maintenance Items that are overdue or due now: Eye exam  Orders/Referrals Placed Today: No orders of the defined types were placed in this encounter.  (Contact our referral department at [phone] if you have not spoken with someone about your referral appointment within the next 5 days)    Follow-up Plan Follow-up with Hali Marry, MD as planned Medicare wellness visit in one year. Patient will access AVS on my chart.      Health Maintenance, Male Adopting a healthy lifestyle and getting preventive care are important in promoting health and wellness. Ask your health care provider about: The right schedule for you to have regular  tests and exams. Things you can do on your own to prevent diseases and keep yourself healthy. What should I know about diet, weight, and exercise? Eat a healthy diet  Eat a diet that includes plenty of vegetables, fruits, low-fat  dairy products, and lean protein. Do not eat a lot of foods that are high in solid fats, added sugars, or sodium. Maintain a healthy weight Body mass index (BMI) is a measurement that can be used to identify possible weight problems. It estimates body fat based on height and weight. Your health care provider can help determine your BMI and help you achieve or maintain a healthy weight. Get regular exercise Get regular exercise. This is one of the most important things you can do for your health. Most adults should: Exercise for at least 150 minutes each week. The exercise should increase your heart rate and make you sweat (moderate-intensity exercise). Do strengthening exercises at least twice a week. This is in addition to the moderate-intensity exercise. Spend less time sitting. Even light physical activity can be beneficial. Watch cholesterol and blood lipids Have your blood tested for lipids and cholesterol at 70 years of age, then have this test every 5 years. You may need to have your cholesterol levels checked more often if: Your lipid or cholesterol levels are high. You are older than 70 years of age. You are at high risk for heart disease. What should I know about cancer screening? Many types of cancers can be detected early and may often be prevented. Depending on your health history and family history, you may need to have cancer screening at various ages. This may include screening for: Colorectal cancer. Prostate cancer. Skin cancer. Lung cancer. What should I know about heart disease, diabetes, and high blood pressure? Blood pressure and heart disease High blood pressure causes heart disease and increases the risk of stroke. This is more likely to develop in people who have high blood pressure readings or are overweight. Talk with your health care provider about your target blood pressure readings. Have your blood pressure checked: Every 3-5 years if you are 18-39 years of  age. Every year if you are 101 years old or older. If you are between the ages of 5 and 94 and are a current or former smoker, ask your health care provider if you should have a one-time screening for abdominal aortic aneurysm (AAA). Diabetes Have regular diabetes screenings. This checks your fasting blood sugar level. Have the screening done: Once every three years after age 80 if you are at a normal weight and have a low risk for diabetes. More often and at a younger age if you are overweight or have a high risk for diabetes. What should I know about preventing infection? Hepatitis B If you have a higher risk for hepatitis B, you should be screened for this virus. Talk with your health care provider to find out if you are at risk for hepatitis B infection. Hepatitis C Blood testing is recommended for: Everyone born from [birth year] through 1964. Anyone with known risk factors for hepatitis C. Sexually transmitted infections (STIs) You should be screened each year for STIs, including gonorrhea and chlamydia, if: You are sexually active and are younger than 70 years of age. You are older than 70 years of age and your health care provider tells you that you are at risk for this type of infection. Your sexual activity has changed since you were last screened, and you are at increased risk  for chlamydia or gonorrhea. Ask your health care provider if you are at risk. Ask your health care provider about whether you are at high risk for HIV. Your health care provider may recommend a prescription medicine to help prevent HIV infection. If you choose to take medicine to prevent HIV, you should first get tested for HIV. You should then be tested every 3 months for as long as you are taking the medicine. Follow these instructions at home: Alcohol use Do not drink alcohol if your health care provider tells you not to drink. If you drink alcohol: Limit how much you have to 0-2 drinks a day. Know how much  alcohol is in your drink. In the [place] one drink equals one 12 oz bottle of beer (355 mL), one 5 oz glass of wine (148 mL), or one 1 oz glass of hard liquor (44 mL). Lifestyle Do not use any products that contain nicotine or tobacco. These products include cigarettes, chewing tobacco, and vaping devices, such as e-cigarettes. If you need help quitting, ask your health care provider. Do not use street drugs. Do not share needles. Ask your health care provider for help if you need support or information about quitting drugs. General instructions Schedule regular health, dental, and eye exams. Stay current with your vaccines. Tell your health care provider if: You often feel depressed. You have ever been abused or do not feel safe at home. Summary Adopting a healthy lifestyle and getting preventive care are important in promoting health and wellness. Follow your health care provider's instructions about healthy diet, exercising, and getting tested or screened for diseases. Follow your health care provider's instructions on monitoring your cholesterol and blood pressure. This information is not intended to replace advice given to you by your health care provider. Make sure you discuss any questions you have with your health care provider. Document Revised: 09/21/2020 Document Reviewed: 09/21/2020 Elsevier Patient Education  [place].

## 2021-06-21 ENCOUNTER — Telehealth: Payer: Self-pay | Admitting: Family Medicine

## 2021-06-21 ENCOUNTER — Other Ambulatory Visit: Payer: Self-pay | Admitting: Family Medicine

## 2021-06-21 DIAGNOSIS — F329 Major depressive disorder, single episode, unspecified: Secondary | ICD-10-CM

## 2021-06-21 MED ORDER — ARIPIPRAZOLE 5 MG PO TABS: 5.0000 mg | ORAL_TABLET | Freq: Every day | ORAL | 0 refills | Status: DC

## 2021-06-21 NOTE — Telephone Encounter (Signed)
Meds ordered this encounter  Medications   ARIPiprazole (ABILIFY) 5 MG tablet    Sig: Take 1 tablet (5 mg total) by mouth daily.    Dispense:  14 tablet    Refill:  0    DX Code Needed  NEED ENW RX.   Sounds like we need to initiate a PA?

## 2021-06-21 NOTE — Telephone Encounter (Signed)
Pt called and stated that his mail order prescription did not refill his Abilify because they need authorization from our office. They are faxing over that request today. He only has 3 days left and he wants to know if while he is waiting for the paperwork to go through if you can send him a 2 week prescription to [facility].

## 2021-06-22 ENCOUNTER — Telehealth: Payer: Self-pay

## 2021-06-22 DIAGNOSIS — F329 Major depressive disorder, single episode, unspecified: Secondary | ICD-10-CM

## 2021-06-22 NOTE — Telephone Encounter (Signed)
Initiated Prior authorization for: Via: Covermymeds Case/Key: [id number] Status: N/A as of 06/22/21 Reason:Available without authorization. Notified Pt via: Mychart Pt has [employer]: Rx group:[id number] Rx [id number] Member [id number]

## 2021-06-22 NOTE — Telephone Encounter (Signed)
Called pharmacy Pt has picked up their medication this afternoon.

## 2021-06-23 MED ORDER — ARIPIPRAZOLE 5 MG PO TABS: 5.0000 mg | ORAL_TABLET | Freq: Every day | ORAL | 1 refills | Status: DC

## 2021-06-23 NOTE — Telephone Encounter (Signed)
Patient aware , my chart msg sent

## 2021-06-23 NOTE — Telephone Encounter (Signed)
Patient was able to get 14 day supply and has sent a request for his Abilify to be filled with [facility].

## 2021-06-23 NOTE — Addendum Note (Signed)
Addended by: Nani Gasser D on: 06/23/2021 10:20 AM   Modules accepted: Orders

## 2021-06-23 NOTE — Telephone Encounter (Signed)
Meds ordered this encounter  Medications   ARIPiprazole (ABILIFY) 5 MG tablet    Sig: Take 1 tablet (5 mg total) by mouth daily.    Dispense:  90 tablet    Refill:  1    DX Code Needed  NEED ENW RX.

## 2021-07-05 ENCOUNTER — Ambulatory Visit: Payer: Medicare Other

## 2021-07-26 ENCOUNTER — Other Ambulatory Visit: Payer: Self-pay | Admitting: Family Medicine

## 2021-08-03 ENCOUNTER — Other Ambulatory Visit: Payer: Self-pay | Admitting: Family Medicine

## 2021-08-03 DIAGNOSIS — Z981 Arthrodesis status: Secondary | ICD-10-CM

## 2021-08-03 DIAGNOSIS — G8929 Other chronic pain: Secondary | ICD-10-CM

## 2021-08-03 DIAGNOSIS — M545 Low back pain, unspecified: Secondary | ICD-10-CM

## 2021-08-03 DIAGNOSIS — M4802 Spinal stenosis, cervical region: Secondary | ICD-10-CM

## 2021-08-04 NOTE — Telephone Encounter (Signed)
Please call patient and let him know that we did refill his Tresa Garter but he really needs to be using it more as needed.  It is technically on the contraindicated list called the beers list where they recommend certain medications be avoided in people over 65.  I know he has been taking it for a long time but we really do have to start cutting back on how often he is using it or maybe even consider switching to an alternative that would be a lot safer.  The risk is that it can cause urinary retention, affect cognition and actually cause weakness in the muscles over time.  So for now I did go ahead and refill 250 tabs instead of 270 tabs.  I would encourage him to start using a half of a tab here there so that he can kind of get his body used to using less of the medication over time.  If his pain management doctor feels that it is appropriate and wants to take over the prescription and that is fine but based on safety concerns we really do need to start tapering down how often he is using it and eventually switch to something again safer. ?

## 2021-08-04 NOTE — Telephone Encounter (Signed)
Patient advised.

## 2021-08-28 ENCOUNTER — Other Ambulatory Visit: Payer: Self-pay | Admitting: Family Medicine

## 2021-09-06 ENCOUNTER — Other Ambulatory Visit: Payer: Self-pay | Admitting: Family Medicine

## 2021-09-12 ENCOUNTER — Other Ambulatory Visit: Payer: Self-pay | Admitting: Family Medicine

## 2021-09-12 DIAGNOSIS — G4700 Insomnia, unspecified: Secondary | ICD-10-CM

## 2021-11-17 ENCOUNTER — Other Ambulatory Visit: Payer: Self-pay | Admitting: Family Medicine

## 2021-11-17 DIAGNOSIS — F329 Major depressive disorder, single episode, unspecified: Secondary | ICD-10-CM

## 2021-12-01 ENCOUNTER — Ambulatory Visit (INDEPENDENT_AMBULATORY_CARE_PROVIDER_SITE_OTHER): Payer: Medicare Other | Admitting: Family Medicine

## 2021-12-01 ENCOUNTER — Encounter: Payer: Self-pay | Admitting: Family Medicine

## 2021-12-01 VITALS — BP 96/53 | HR 61 | Ht 70.0 in | Wt 190.0 lb

## 2021-12-01 DIAGNOSIS — F321 Major depressive disorder, single episode, moderate: Secondary | ICD-10-CM | POA: Diagnosis not present

## 2021-12-01 DIAGNOSIS — M4802 Spinal stenosis, cervical region: Secondary | ICD-10-CM

## 2021-12-01 DIAGNOSIS — F339 Major depressive disorder, recurrent, unspecified: Secondary | ICD-10-CM

## 2021-12-01 DIAGNOSIS — J432 Centrilobular emphysema: Secondary | ICD-10-CM

## 2021-12-01 DIAGNOSIS — M545 Low back pain, unspecified: Secondary | ICD-10-CM

## 2021-12-01 DIAGNOSIS — G8929 Other chronic pain: Secondary | ICD-10-CM

## 2021-12-01 DIAGNOSIS — F1011 Alcohol abuse, in remission: Secondary | ICD-10-CM

## 2021-12-01 DIAGNOSIS — Z981 Arthrodesis status: Secondary | ICD-10-CM | POA: Diagnosis not present

## 2021-12-01 MED ORDER — GABAPENTIN 100 MG PO CAPS: ORAL_CAPSULE | ORAL | 0 refills | Status: DC

## 2021-12-01 MED ORDER — CARISOPRODOL 350 MG PO TABS: 350.0000 mg | ORAL_TABLET | Freq: Three times a day (TID) | ORAL | 0 refills | Status: DC | PRN

## 2021-12-01 NOTE — Progress Notes (Signed)
Established Patient Office Visit  Subjective   Patient ID: Paul Fowler, male    DOB: 26-Aug-1950  Age: 71 y.o. MRN: [id number]  Chief Complaint  Patient presents with   Follow-up         HPI  For chronic pain management he was finally able to reestablish with a new pain management doctor.  He is currently on hydromorphone.  We are still writing his Soma.  He takes it 2-3 times per day.  Follow-up depression-is happy with his current regimen and feels stable and it does not want to make any adjustments or changes today.  He did want to discuss the gabapentin we had previously discussed tapering him off.  He is only taking the 600 mg once a day in the evenings. Initially started when going through detox. Has been sober for 5 years now.       ROS    Objective:     BP (!) 96/53 (BP Location: Left Arm)   Pulse 61   Ht 5\' 10"  (1.778 m)   Wt 190 lb (86.2 kg)   SpO2 95%   BMI 27.26 kg/m    Physical Exam Constitutional:      Appearance: He is well-developed.  HENT:     Head: Normocephalic and atraumatic.  Cardiovascular:     Rate and Rhythm: Normal rate and regular rhythm.     Heart sounds: Normal heart sounds.  Pulmonary:     Effort: Pulmonary effort is normal.     Breath sounds: Normal breath sounds.  Skin:    General: Skin is warm and dry.  Neurological:     Mental Status: He is alert and oriented to person, place, and time.  Psychiatric:        Behavior: Behavior normal.      No results found for any visits on 12/01/21.    The ASCVD Risk score (Arnett DK, et al., 2019) failed to calculate for the following reasons:   The valid total cholesterol range is 130 to 320 mg/dL    Assessment & Plan:   Problem List Items Addressed This Visit       Respiratory   Centrilobular emphysema (HCC)    Mildly coarse breath sounds on exam but no other worrisome findings.  No recent flares or exacerbations.  Encouraged him to just be careful with the heat and  humidity and poor air quality.        Musculoskeletal and Integument   Foraminal stenosis of cervical region   Relevant Medications   carisoprodol (SOMA) 350 MG tablet     Other   S/P lumbar spinal fusion   Relevant Medications   carisoprodol (SOMA) 350 MG tablet   Depression, recurrent (HCC)    He is really feeling like he is doing well on his current medication regimen and very stable.  PHQ-9 score of 0 and GAD-7 score of 0.  Continue current regimen.      Chronic bilateral low back pain   Relevant Medications   carisoprodol (SOMA) 350 MG tablet   gabapentin (NEURONTIN) 100 MG capsule   Alcohol abuse, in remission    Sober for 5 years now which is absolutely phenomenal.      Other Visit Diagnoses     Current moderate episode of major depressive disorder without prior episode (HCC)    -  Primary      Note, blood pressure is a little bit low today but he says he is feeling fine.  He  is planning on getting out in the heat mowing the grass today so I did encourage him to make sure he is hydrated well before that.  Also discussed tapering gabapentin.  It was initially started to prevent seizures when he was going to [facility].  We will go ahead and taper completely off.  Return in about 6 months (around 06/03/2022) for Mood and fasting labswork .    Nani Gasser, MD

## 2021-12-01 NOTE — Assessment & Plan Note (Signed)
He is really feeling like he is doing well on his current medication regimen and very stable.  PHQ-9 score of 0 and GAD-7 score of 0.  Continue current regimen.

## 2021-12-01 NOTE — Assessment & Plan Note (Signed)
Sober for 5 years now which is absolutely phenomenal.

## 2021-12-01 NOTE — Assessment & Plan Note (Signed)
Mildly coarse breath sounds on exam but no other worrisome findings.  No recent flares or exacerbations.  Encouraged him to just be careful with the heat and humidity and poor air quality.

## 2021-12-19 ENCOUNTER — Other Ambulatory Visit: Payer: Self-pay | Admitting: Cardiology

## 2021-12-19 DIAGNOSIS — I2510 Atherosclerotic heart disease of native coronary artery without angina pectoris: Secondary | ICD-10-CM

## 2021-12-19 DIAGNOSIS — E7800 Pure hypercholesterolemia, unspecified: Secondary | ICD-10-CM

## 2022-01-07 ENCOUNTER — Ambulatory Visit: Payer: Medicare Other

## 2022-01-07 DIAGNOSIS — Z87891 Personal history of nicotine dependence: Secondary | ICD-10-CM

## 2022-01-10 ENCOUNTER — Other Ambulatory Visit: Payer: Self-pay | Admitting: Acute Care

## 2022-01-10 DIAGNOSIS — Z122 Encounter for screening for malignant neoplasm of respiratory organs: Secondary | ICD-10-CM

## 2022-01-10 DIAGNOSIS — Z87891 Personal history of nicotine dependence: Secondary | ICD-10-CM

## 2022-01-13 ENCOUNTER — Other Ambulatory Visit: Payer: Self-pay | Admitting: Family Medicine

## 2022-01-13 DIAGNOSIS — F329 Major depressive disorder, single episode, unspecified: Secondary | ICD-10-CM

## 2022-01-22 ENCOUNTER — Other Ambulatory Visit: Payer: Self-pay | Admitting: Family Medicine

## 2022-03-02 ENCOUNTER — Other Ambulatory Visit: Payer: Self-pay | Admitting: Family Medicine

## 2022-04-08 ENCOUNTER — Other Ambulatory Visit: Payer: Self-pay | Admitting: Family Medicine

## 2022-04-11 ENCOUNTER — Telehealth: Payer: Self-pay | Admitting: Family Medicine

## 2022-04-11 ENCOUNTER — Ambulatory Visit (INDEPENDENT_AMBULATORY_CARE_PROVIDER_SITE_OTHER): Payer: Medicare Other | Admitting: Family Medicine

## 2022-04-11 ENCOUNTER — Encounter: Payer: Self-pay | Admitting: Family Medicine

## 2022-04-11 VITALS — BP 108/53 | HR 58 | Ht 70.0 in | Wt 205.0 lb

## 2022-04-11 DIAGNOSIS — E782 Mixed hyperlipidemia: Secondary | ICD-10-CM

## 2022-04-11 DIAGNOSIS — I700 Atherosclerosis of aorta: Secondary | ICD-10-CM

## 2022-04-11 DIAGNOSIS — Z111 Encounter for screening for respiratory tuberculosis: Secondary | ICD-10-CM

## 2022-04-11 DIAGNOSIS — G8929 Other chronic pain: Secondary | ICD-10-CM

## 2022-04-11 DIAGNOSIS — M4802 Spinal stenosis, cervical region: Secondary | ICD-10-CM | POA: Diagnosis not present

## 2022-04-11 DIAGNOSIS — Z125 Encounter for screening for malignant neoplasm of prostate: Secondary | ICD-10-CM

## 2022-04-11 DIAGNOSIS — M545 Low back pain, unspecified: Secondary | ICD-10-CM

## 2022-04-11 DIAGNOSIS — Z981 Arthrodesis status: Secondary | ICD-10-CM | POA: Diagnosis not present

## 2022-04-11 DIAGNOSIS — Z9225 Personal history of immunosupression therapy: Secondary | ICD-10-CM

## 2022-04-11 DIAGNOSIS — Z23 Encounter for immunization: Secondary | ICD-10-CM

## 2022-04-11 DIAGNOSIS — F1011 Alcohol abuse, in remission: Secondary | ICD-10-CM

## 2022-04-11 DIAGNOSIS — I2510 Atherosclerotic heart disease of native coronary artery without angina pectoris: Secondary | ICD-10-CM | POA: Diagnosis not present

## 2022-04-11 MED ORDER — CARISOPRODOL 350 MG PO TABS: 350.0000 mg | ORAL_TABLET | Freq: Three times a day (TID) | ORAL | 0 refills | Status: DC | PRN

## 2022-04-11 NOTE — Telephone Encounter (Signed)
Call his pain management doc ( I don't see notes) and let them know I will no longer prescribe hsi soma and that are welcome to take over it snice it is for his spine and they are managing his pain. I don't have any notes or records from their office so I am not sure if they have discussed this already or note.

## 2022-04-11 NOTE — Assessment & Plan Note (Signed)
Needs to have a gold interferon test ordered so we will place that on his lab order.

## 2022-04-11 NOTE — Assessment & Plan Note (Signed)
Followed by pain management. 

## 2022-04-11 NOTE — Progress Notes (Signed)
Established Patient Office Visit  Subjective   Patient ID: Paul Fowler, male    DOB: Jun 04, 1950  Age: 71 y.o. MRN: [id number]  Chief Complaint  Patient presents with   Medication Refill    HPI  Follow-up substance abuse disorder-he has been doing well for the last 5 years and so we have decided to go ahead and taper off his gabapentin.  He did really well with the taper and has not had any recurring cravings.  He also has not noticed a big difference in the pain in his back and spine.  He does see his pain management specialist for chronic back pain he has been doing well lately but did have some questions about the Soma.  We had discussed previously that this prescription really needs to come from his pain management.  He does have an appointment coming up in December.  He did mention it to them but they do seem hesitant but he says it is the only muscle relaxer that really provides relief particularly for his neck pain.  Hyperlipidemia - tolerating stating well with no myalgias or significant side effects.  Lab Results  Component Value Date   CHOL 119 05/11/2021   HDL 42 05/11/2021   LDLCALC 61 05/11/2021   TRIG 75 05/11/2021   CHOLHDL 2.8 05/11/2021        ROS    Objective:     BP (!) 108/53   Pulse (!) 58   Ht 5\' 10"  (1.778 m)   Wt 205 lb (93 kg)   SpO2 95%   BMI 29.41 kg/m    Physical Exam Constitutional:      Appearance: He is well-developed.  HENT:     Head: Normocephalic and atraumatic.  Cardiovascular:     Rate and Rhythm: Normal rate and regular rhythm.     Heart sounds: Normal heart sounds.  Pulmonary:     Effort: Pulmonary effort is normal.     Breath sounds: Normal breath sounds.  Skin:    General: Skin is warm and dry.  Neurological:     Mental Status: He is alert and oriented to person, place, and time.  Psychiatric:        Behavior: Behavior normal.      No results found for any visits on 04/11/22.    The ASCVD Risk score  (Arnett DK, et al., 2019) failed to calculate for the following reasons:   The valid total cholesterol range is 130 to 320 mg/dL    Assessment & Plan:   Problem List Items Addressed This Visit       Cardiovascular and Mediastinum   Atherosclerosis of aorta (HCC)    Due to recheck lipids to make sure that LDL is at goal.      Relevant Orders   Lipid panel     Musculoskeletal and Integument   Foraminal stenosis of cervical region    Did refill 1 month of the Soma.  Encouraged him to discuss again with pain management.  This medication is specifically for his spinal pain and muscle spasm and they are currently managing that condition.  They should be the ones addressing this medication and if they do not feel it is appropriate then it will need to be discontinued.      Relevant Medications   carisoprodol (SOMA) 350 MG tablet   Dysbetalipoproteinemia   Relevant Orders   Lipid panel     Other   S/P lumbar spinal fusion   Relevant Medications  carisoprodol (SOMA) 350 MG tablet   History of immunosuppression therapy    Needs to have a gold interferon test ordered so we will place that on his lab order.      Relevant Orders   PSA   Lipid panel   COMPLETE METABOLIC PANEL WITH GFR   CBC   QuantiFERON-TB Gold Plus   Chronic bilateral low back pain    Followed by pain management.      Relevant Medications   carisoprodol (SOMA) 350 MG tablet   Alcohol abuse, in remission    Stable.  He is doing well abstaining.      Other Visit Diagnoses     Need for immunization against influenza    -  Primary   Relevant Orders   Flu Vaccine QUAD High Dose(Fluad) (Completed)   Screening-pulmonary TB       Relevant Orders   PSA   Lipid panel   COMPLETE METABOLIC PANEL WITH GFR   CBC   QuantiFERON-TB Gold Plus   Screening for prostate cancer       Relevant Orders   PSA       Return in about 6 months (around 10/10/2022) for Medications, mood, etc .    Beatrice Lecher,  MD

## 2022-04-11 NOTE — Progress Notes (Signed)
Pt would like to discuss Gabapentin, Abilify and Soma refills.

## 2022-04-11 NOTE — Assessment & Plan Note (Signed)
Due to recheck lipids to make sure that LDL is at goal.

## 2022-04-11 NOTE — Assessment & Plan Note (Signed)
Stable.  He is doing well abstaining.

## 2022-04-11 NOTE — Assessment & Plan Note (Signed)
Did refill 1 month of the [place].  Encouraged him to discuss again with pain management.  This medication is specifically for his spinal pain and muscle spasm and they are currently managing that condition.  They should be the ones addressing this medication and if they do not feel it is appropriate then it will need to be discontinued.

## 2022-04-13 NOTE — Telephone Encounter (Signed)
It is now seen at pain management on [facility]

## 2022-04-13 NOTE — Telephone Encounter (Signed)
Spoke w/Mary at Dr. Migdalia Dk office she informed me that he hasn't been there since 05/28/2021 and he is no longer a pt at that office.

## 2022-04-18 ENCOUNTER — Other Ambulatory Visit: Payer: Self-pay | Admitting: Family Medicine

## 2022-04-18 DIAGNOSIS — G4700 Insomnia, unspecified: Secondary | ICD-10-CM

## 2022-05-30 NOTE — Progress Notes (Signed)
HPI: FU coronary artery disease..  Abdominal ultrasound August 2021 showed no aneurysm.  Chest CT August 2022 showed aortic atherosclerosis and coronary calcification including left main.  There was also note of emphysema and mild calcification of the aortic valve.  Nuclear study October 2022 showed ejection fraction 52% and no ischemia.  Since last seen, the patient denies any dyspnea on exertion, orthopnea, PND, pedal edema, palpitations, syncope or chest pain.   Current Outpatient Medications  Medication Sig Dispense Refill   Adalimumab 40 MG/0.4ML PSKT Inject 0.4 mLs into the skin every 7 (seven) days.     ARIPiprazole (ABILIFY) 5 MG tablet TAKE 1 TABLET EVERY DAY 90 tablet 1   aspirin EC 81 MG tablet Take 1 tablet (81 mg total) by mouth daily. Swallow whole. 90 tablet 3   atorvastatin (LIPITOR) 80 MG tablet TAKE 1 TABLET EVERY DAY 90 tablet 3   b complex vitamins tablet Take 1 tablet by mouth daily.     buPROPion (WELLBUTRIN XL) 300 MG 24 hr tablet TAKE 1 TABLET EVERY DAY 90 tablet 3   carisoprodol (SOMA) 350 MG tablet Take 1 tablet (350 mg total) by mouth 3 (three) times daily as needed for muscle spasms. This is a 90 days supply 90 tablet 0   escitalopram (LEXAPRO) 20 MG tablet TAKE 1 TABLET EVERY DAY 90 tablet 3   HYDROmorphone (DILAUDID) 4 MG tablet Take 4 mg by mouth every 6 (six) hours as needed for severe pain. Dr. Aggie Moats     hydrOXYzine (ATARAX) 25 MG tablet TAKE 1 TABLET TWICE DAILY AS NEEDED 180 tablet 3   naproxen (NAPROSYN) 500 MG tablet Take by mouth.     sildenafil (VIAGRA) 100 MG tablet Take 0.5-1 tablets (50-100 mg total) by mouth daily as needed for erectile dysfunction. 15 tablet 3   traZODone (DESYREL) 100 MG tablet TAKE 2 TABLETS AT BEDTIME 180 tablet 3   No current facility-administered medications for this visit.     Past Medical History:  Diagnosis Date   Allergy    See chart   Arthritis    Crohn's disease (HCC)    GERD (gastroesophageal reflux  disease)    occasionally has indigestion   Hyperlipidemia    Multiple sclerosis (HCC)    DX 3-4 YRS AGO-- ASYMPTOMATIC   Neuromuscular disorder (HCC)    MS  ON NO MEDS    Ulcerative colitis (HCC)    LAST FLAREUP SPRING 2014    Past Surgical History:  Procedure Laterality Date   BACK SURGERY     CERVICAL SPINE SURGERY     FUSION   ELBOW SURGERY     LEFT   MAXIMUM ACCESS (MAS)POSTERIOR LUMBAR INTERBODY FUSION (PLIF) 1 LEVEL N/A 07/11/2013   Procedure: FOR MAXIMUM ACCESS (MAS) POSTERIOR LUMBAR INTERBODY FUSION (PLIF) 1 LEVEL LUMBAR FOUR-FIVE;  Surgeon: Tia Alert, MD;  Location: [facility];  Service: Neurosurgery;  Laterality: N/A;   SPINE SURGERY     Approximately 10 years ago   THUMB ARTHROSCOPY     LEFT   TONSILLECTOMY     TORN ROTATOR     LEFT SHOULDER    Social History   Socioeconomic History   Marital status: Divorced    Spouse name: Not on file   Number of children: 2   Years of education: 14   Highest education level: Associate degree: academic program  Occupational History   Occupation: customer support specialists    Comment: retired  Tobacco Use  Smoking status: Former    Packs/day: 1.00    Years: 30.00    Total pack years: 30.00    Types: Cigarettes, E-cigarettes    Quit date: 05/16/2013    Years since quitting: 9.0   Smokeless tobacco: Former   Tobacco comments:    Quit vaping x 2 weeks   Vaping Use   Vaping Use: Every day   Substances: Nicotine, Flavoring  Substance and Sexual Activity   Alcohol use: Not Currently   Drug use: Never   Sexual activity: Not Currently    Birth control/protection: Abstinence  Other Topics Concern   Not on file  Social History Narrative   Lives alone. He has 2 children. He enjoys playing drums and ride his motorcycle.    Social Determinants of Health   Financial Resource Strain: Low Risk  (06/18/2021)   Overall Financial Resource Strain (CARDIA)    Difficulty of Paying Living Expenses: Not hard at all  Food  Insecurity: No Food Insecurity (06/18/2021)   Hunger Vital Sign    Worried About Running Out of Food in the Last Year: Never true    Ran Out of Food in the Last Year: Never true  Transportation Needs: No Transportation Needs (06/18/2021)   PRAPARE - Hydrologist (Medical): No    Lack of Transportation (Non-Medical): No  Physical Activity: Inactive (06/18/2021)   Exercise Vital Sign    Days of Exercise per Week: 0 days    Minutes of Exercise per Session: 0 min  Stress: No Stress Concern Present (06/18/2021)   [place]    Feeling of Stress : Not at all  Social Connections: Moderately Integrated (06/18/2021)   Social Connection and Isolation Panel [NHANES]    Frequency of Communication with Friends and Family: Three times a week    Frequency of Social Gatherings with Friends and Family: Once a week    Attends Religious Services: More than 4 times per year    Active Member of [employer] or Organizations: Yes    Attends Music therapist: More than 4 times per year    Marital Status: Divorced  Intimate Partner Violence: Not At Risk (06/18/2021)   Humiliation, Afraid, Rape, and Kick questionnaire    Fear of Current or Ex-Partner: No    Emotionally Abused: No    Physically Abused: No    Sexually Abused: No    Family History  Problem Relation Age of Onset   Arthritis Mother    Cancer Father    Arthritis Father     ROS: no fevers or chills, productive cough, hemoptysis, dysphasia, odynophagia, melena, hematochezia, dysuria, hematuria, rash, seizure activity, orthopnea, PND, pedal edema, claudication. Remaining systems are negative.  Physical Exam: Well-developed well-nourished in no acute distress.  Skin is warm and dry.  HEENT is normal.  Neck is supple.  Chest is clear to auscultation with normal expansion.  Cardiovascular exam is regular rate and rhythm.  Abdominal exam nontender or  distended. No masses palpated. Extremities show no edema. neuro grossly intact  ECG-sinus bradycardia at a rate of 58, left ventricular hypertrophy.  Personally reviewed  A/P  1 coronary artery disease-this is based on previous CT showing coronary calcification.  However follow-up nuclear study showed no ischemia.  We will therefore plan medical therapy including aspirin and statin.  2 hyperlipidemia-continue Lipitor.  Recent LDL will 96.  Add Zetia 10 mg daily.  Check lipids and liver in 8 weeks.  3 history of vaping-we previously discussed importance of avoiding.  Kirk Ruths, MD

## 2022-06-12 LAB — QUANTIFERON-TB GOLD PLUS
Mitogen-NIL: >10 IU/mL
NIL: 0.02 IU/mL
QuantiFERON-TB Gold Plus: NEGATIVE
TB1-NIL: 0.01 IU/mL
TB2-NIL: 0.01 IU/mL

## 2022-06-12 LAB — COMPLETE METABOLIC PANEL WITH GFR
AG Ratio: 1.5 (calc) (ref 1.0–2.5)
ALT: 12 U/L (ref 9–46)
AST: 19 U/L (ref 10–35)
Albumin: 4.3 g/dL (ref 3.6–5.1)
Alkaline phosphatase (APISO): 86 U/L (ref 35–144)
BUN: 9 mg/dL (ref 7–25)
CO2: 27 mmol/L (ref 20–32)
Calcium: 9.2 mg/dL (ref 8.6–10.3)
Chloride: 101 mmol/L (ref 98–110)
Creat: 0.98 mg/dL (ref 0.70–1.28)
Globulin: 2.9 g/dL (calc) (ref 1.9–3.7)
Glucose, Bld: 85 mg/dL (ref 65–99)
Potassium: 4.4 mmol/L (ref 3.5–5.3)
Sodium: 137 mmol/L (ref 135–146)
Total Bilirubin: 0.4 mg/dL (ref 0.2–1.2)
Total Protein: 7.2 g/dL (ref 6.1–8.1)
eGFR: 83 mL/min/{1.73_m2} (ref >=60)

## 2022-06-12 LAB — LIPID PANEL
Cholesterol: 171 mg/dL (ref <200)
HDL: 54 mg/dL (ref >=40)
LDL Cholesterol (Calc): 96 mg/dL (calc)
Non-HDL Cholesterol (Calc): 117 mg/dL (calc) (ref <130)
Total CHOL/HDL Ratio: 3.2 (calc) (ref <5.0)
Triglycerides: 115 mg/dL (ref <150)

## 2022-06-12 LAB — CBC
HCT: 44.3 % (ref 38.5–50.0)
Hemoglobin: 14.6 g/dL (ref 13.2–17.1)
MCH: 30 pg (ref 27.0–33.0)
MCHC: 33 g/dL (ref 32.0–36.0)
MCV: 91 fL (ref 80.0–100.0)
MPV: 8.8 fL (ref 7.5–12.5)
Platelets: 250 10*3/uL (ref 140–400)
RBC: 4.87 10*6/uL (ref 4.20–5.80)
RDW: 13.8 % (ref 11.0–15.0)
WBC: 7.1 10*3/uL (ref 3.8–10.8)

## 2022-06-12 LAB — PSA: PSA: 3.57 ng/mL (ref <=4.00)

## 2022-06-13 ENCOUNTER — Encounter: Payer: Self-pay | Admitting: Cardiology

## 2022-06-13 ENCOUNTER — Ambulatory Visit (INDEPENDENT_AMBULATORY_CARE_PROVIDER_SITE_OTHER): Payer: Medicare Other | Admitting: Cardiology

## 2022-06-13 VITALS — BP 117/73 | HR 58 | Ht 70.0 in | Wt 206.1 lb

## 2022-06-13 DIAGNOSIS — I2510 Atherosclerotic heart disease of native coronary artery without angina pectoris: Secondary | ICD-10-CM

## 2022-06-13 DIAGNOSIS — E7800 Pure hypercholesterolemia, unspecified: Secondary | ICD-10-CM

## 2022-06-13 MED ORDER — EZETIMIBE 10 MG PO TABS: 10.0000 mg | ORAL_TABLET | Freq: Every day | ORAL | 3 refills | Status: DC

## 2022-06-13 NOTE — Patient Instructions (Signed)
Medication Instructions:   START EZETIMIBE 10 MG ONCE DAILY  *If you need a refill on your cardiac medications before your next appointment, please call your pharmacy*   Lab Work:  Your physician recommends that you return for lab work in: 8 WEEKS-FASTING  If you have labs (blood work) drawn today and your tests are completely normal, you will receive your results only by: MyChart Message (if you have MyChart) OR A paper copy in the mail If you have any lab test that is abnormal or we need to change your treatment, we will call you to review the results.   Follow-Up: At [facility], you and your health needs are our priority.  As part of our continuing mission to provide you with exceptional heart care, we have created designated Provider Care Teams.  These Care Teams include your primary Cardiologist (physician) and Advanced Practice Providers (APPs -  Physician Assistants and Nurse Practitioners) who all work together to provide you with the care you need, when you need it.  We recommend signing up for the patient portal called "MyChart".  Sign up information is provided on this After Visit Summary.  MyChart is used to connect with patients for Virtual Visits (Telemedicine).  Patients are able to view lab/test results, encounter notes, upcoming appointments, etc.  Non-urgent messages can be sent to your provider as well.   To learn more about what you can do with MyChart, go to [email or web address].    Your next appointment:   12 month(s)  Provider:   Brian Crenshaw, MD      

## 2022-06-13 NOTE — Progress Notes (Signed)
Hi Paul Fowler, your prostate test is normal but trending up. Plan to repeat in 4 months. Your cholesterol is up from last time.  All other tests are normal.  Negative for TB.

## 2022-06-30 ENCOUNTER — Ambulatory Visit (INDEPENDENT_AMBULATORY_CARE_PROVIDER_SITE_OTHER): Payer: Medicare Other | Admitting: Family Medicine

## 2022-06-30 DIAGNOSIS — Z0000 Encounter for general adult medical examination without abnormal findings: Secondary | ICD-10-CM | POA: Diagnosis not present

## 2022-06-30 NOTE — Patient Instructions (Signed)
[place] Maintenance Summary and Written Plan of Care  [name] ,  Thank you for allowing me to perform your Medicare Annual Wellness Visit and for your ongoing commitment to your health.   Health Maintenance & Immunization History Health Maintenance  Topic Date Due   COVID-19 Vaccine (4 - 2023-24 season) 07/16/2022 (Originally 01/14/2022)   Hepatitis C Screening  07/01/2023 (Originally 12/06/1968)   Lung Cancer Screening  01/08/2023   Medicare Annual Wellness (AWV)  07/01/2023   DTaP/Tdap/Td (4 - Td or Tdap) 09/07/2026   COLONOSCOPY (Pts 45-79yr Insurance coverage will need to be confirmed)  04/09/2028   Pneumonia Vaccine 72 Years old  Completed   INFLUENZA VACCINE  Completed   Zoster Vaccines- Shingrix  Completed   HPV VACCINES  Aged Out   Immunization History  Administered Date(s) Administered   Fluad Quad(high Dose 65+) 01/18/2019, 03/23/2020, 03/09/2021, 04/11/2022   Influenza, High Dose Seasonal PF 02/07/2018   Moderna SARS-COV2 Booster Vaccination 05/04/2020, 11/23/2020   Moderna Sars-Covid-2 Vaccination 08/04/2019, 09/04/2019, 03/23/2020   PNEUMOCOCCAL CONJUGATE-20 12/01/2020   PPD Test 02/13/2015   Pneumococcal Conjugate-13 10/17/2019   Td 06/27/2007   Tdap 05/20/2006, 09/06/2016   Zoster Recombinat (Shingrix) 10/17/2019, 11/23/2020    These are the patient goals that we discussed:  Goals Addressed               This Visit's Progress     DIET - [place]stated)        06/30/2022 AWV Goal: Exercise for General Health  Patient will verbalize understanding of the benefits of increased physical activity: Exercising regularly is important. It will improve your overall fitness, flexibility, and endurance. Regular exercise also will improve your overall health. It can help you control your weight, reduce stress, and improve your bone density. Over the next year, patient will increase physical activity as tolerated  with a goal of at least 150 minutes of moderate physical activity per week.  You can tell that you are exercising at a moderate intensity if your heart starts beating faster and you start breathing faster but can still hold a conversation. Moderate-intensity exercise ideas include: Walking 1 mile (1.6 km) in about 15 minutes Biking Hiking Golfing Dancing Water aerobics Patient will verbalize understanding of everyday activities that increase physical activity by providing examples like the following: Yard work, such as: PSales promotion account executiveGardening Washing windows or floors Patient will be able to explain general safety guidelines for exercising:  Before you start a new exercise program, talk with your health care provider. Do not exercise so much that you hurt yourself, feel dizzy, or get very short of breath. Wear comfortable clothes and wear shoes with good support. Drink plenty of water while you exercise to prevent dehydration or heat stroke. Work out until your breathing and your heartbeat get faster.          This is a list of Health Maintenance Items that are overdue or due now: There are no preventive care reminders to display for this patient.   Orders/Referrals Placed Today: No orders of the defined types were placed in this encounter.  (Contact our referral department at [phone] you have not spoken with someone about your referral appointment within the next 5 days)    Follow-up Plan Follow-up with MHali Marry MD as planned Medicare wellness visit in one year.  Patient will access AVS on  my chart.      Health Maintenance, Male Adopting a healthy lifestyle and getting preventive care are important in promoting health and wellness. Ask your health care provider about: The right schedule for you to have regular tests and exams. Things you can do on your own to  prevent diseases and keep yourself healthy. What should I know about diet, weight, and exercise? Eat a healthy diet  Eat a diet that includes plenty of vegetables, fruits, low-fat dairy products, and lean protein. Do not eat a lot of foods that are high in solid fats, added sugars, or sodium. Maintain a healthy weight Body mass index (BMI) is a measurement that can be used to identify possible weight problems. It estimates body fat based on height and weight. Your health care provider can help determine your BMI and help you achieve or maintain a healthy weight. Get regular exercise Get regular exercise. This is one of the most important things you can do for your health. Most adults should: Exercise for at least 150 minutes each week. The exercise should increase your heart rate and make you sweat (moderate-intensity exercise). Do strengthening exercises at least twice a week. This is in addition to the moderate-intensity exercise. Spend less time sitting. Even light physical activity can be beneficial. Watch cholesterol and blood lipids Have your blood tested for lipids and cholesterol at 71 years of age, then have this test every 5 years. You may need to have your cholesterol levels checked more often if: Your lipid or cholesterol levels are high. You are older than 71 years of age. You are at high risk for heart disease. What should I know about cancer screening? Many types of cancers can be detected early and may often be prevented. Depending on your health history and family history, you may need to have cancer screening at various ages. This may include screening for: Colorectal cancer. Prostate cancer. Skin cancer. Lung cancer. What should I know about heart disease, diabetes, and high blood pressure? Blood pressure and heart disease High blood pressure causes heart disease and increases the risk of stroke. This is more likely to develop in people who have high blood pressure  readings or are overweight. Talk with your health care provider about your target blood pressure readings. Have your blood pressure checked: Every 3-5 years if you are 59-48 years of age. Every year if you are 42 years old or older. If you are between the ages of 53 and 31 and are a current or former smoker, ask your health care provider if you should have a one-time screening for abdominal aortic aneurysm (AAA). Diabetes Have regular diabetes screenings. This checks your fasting blood sugar level. Have the screening done: Once every three years after age 34 if you are at a normal weight and have a low risk for diabetes. More often and at a younger age if you are overweight or have a high risk for diabetes. What should I know about preventing infection? Hepatitis B If you have a higher risk for hepatitis B, you should be screened for this virus. Talk with your health care provider to find out if you are at risk for hepatitis B infection. Hepatitis C Blood testing is recommended for: Everyone born from [birth year] through 1964. Anyone with known risk factors for hepatitis C. Sexually transmitted infections (STIs) You should be screened each year for STIs, including gonorrhea and chlamydia, if: You are sexually active and are younger than 71 years of age. You are  older than 71 years of age and your health care provider tells you that you are at risk for this type of infection. Your sexual activity has changed since you were last screened, and you are at increased risk for chlamydia or gonorrhea. Ask your health care provider if you are at risk. Ask your health care provider about whether you are at high risk for HIV. Your health care provider may recommend a prescription medicine to help prevent HIV infection. If you choose to take medicine to prevent HIV, you should first get tested for HIV. You should then be tested every 3 months for as long as you are taking the medicine. Follow these instructions  at home: Alcohol use Do not drink alcohol if your health care provider tells you not to drink. If you drink alcohol: Limit how much you have to 0-2 drinks a day. Know how much alcohol is in your drink. In the [place] one drink equals one 12 oz bottle of beer (355 mL), one 5 oz glass of wine (148 mL), or one 1 oz glass of hard liquor (44 mL). Lifestyle Do not use any products that contain nicotine or tobacco. These products include cigarettes, chewing tobacco, and vaping devices, such as e-cigarettes. If you need help quitting, ask your health care provider. Do not use street drugs. Do not share needles. Ask your health care provider for help if you need support or information about quitting drugs. General instructions Schedule regular health, dental, and eye exams. Stay current with your vaccines. Tell your health care provider if: You often feel depressed. You have ever been abused or do not feel safe at home. Summary Adopting a healthy lifestyle and getting preventive care are important in promoting health and wellness. Follow your health care provider's instructions about healthy diet, exercising, and getting tested or screened for diseases. Follow your health care provider's instructions on monitoring your cholesterol and blood pressure. This information is not intended to replace advice given to you by your health care provider. Make sure you discuss any questions you have with your health care provider. Document Revised: 09/21/2020 Document Reviewed: 09/21/2020 Elsevier Patient Education  [place].

## 2022-06-30 NOTE — Progress Notes (Signed)
MEDICARE ANNUAL WELLNESS VISIT  06/30/2022  Telephone Visit Disclaimer This Medicare AWV was conducted by telephone due to national recommendations for restrictions regarding the COVID-19 Pandemic (e.g. social distancing).  I verified, using two identifiers, that I am speaking with Tacey Heap or their authorized healthcare agent. I discussed the limitations, risks, security, and privacy concerns of performing an evaluation and management service by telephone and the potential availability of an in-person appointment in the future. The patient expressed understanding and agreed to proceed.  Location of Patient: Home Location of Provider (nurse):  Provider home  Subjective:    RAMY FLINT is a 72 y.o. male patient of Metheney, Rene Kocher, MD who had a Medicare Annual Wellness Visit today via telephone. Jerell is Retired and lives alone. he has 2 children. he reports that he is socially active and does interact with friends/family regularly. he is minimally physically active and enjoys playing the drums and riding his motorcycle.  Patient Care Team: Hali Marry, MD as PCP - General (Family Medicine) Darlis Loan, MD as Referring Physician (Gastroenterology) Kriste Basque, MD as Referring Physician (Pain Medicine)     06/30/2022    9:54 AM 06/18/2021    2:19 PM 02/03/2020    1:21 PM 01/28/2019    2:06 PM 09/30/2018   10:58 AM 05/02/2017   11:08 AM 07/11/2013   10:58 AM  Advanced Directives  Does Patient Have a Medical Advance Directive? Yes Yes Yes Yes Yes Yes Patient has advance directive, copy not in chart  Type of Advance Directive Living will Living will;Healthcare Power of [place] will [place];Living will Living will [place];Living will Living will  Does patient want to make changes to medical advance directive? No - Patient declined  No - Patient declined No - Patient declined No - Patient declined  No change  requested  Copy of [place] in Chart?  No - copy requested  No - copy requested  No - copy requested     Hospital Utilization Over the Past 12 Months: # of hospitalizations or ER visits: 0 # of surgeries: 0  Review of Systems    Patient reports that his overall health is unchanged compared to last year.  History obtained from chart review and the patient  Patient Reported Readings (BP, Pulse, CBG, Weight, etc) none  Pain Assessment Pain : No/denies pain     Current Medications & Allergies (verified) Allergies as of 06/30/2022       Reactions   Buspirone Other (See Comments)   "wired up, out of body" "wired up, out of body"   Diclofenac Other (See Comments)   Stomach upset Stomach upset   Interferon Beta-1a Other (See Comments)   'bad reaction" 'bad reaction" 'bad reaction"   Venlafaxine Other (See Comments)   "felt like a zombie" "felt like a zombie"   Methocarbamol Other (See Comments)   Saccharin Nausea Only        Medication List        Accurate as of June 30, 2022  9:59 AM. If you have any questions, ask your nurse or doctor.          Adalimumab 40 MG/0.4ML Pskt Inject 0.4 mLs into the skin every 7 (seven) days.   ARIPiprazole 5 MG tablet Commonly known as: ABILIFY TAKE 1 TABLET EVERY DAY   aspirin EC 81 MG tablet Take 1 tablet (81 mg total) by mouth daily. Swallow whole.  atorvastatin 80 MG tablet Commonly known as: LIPITOR TAKE 1 TABLET EVERY DAY   b complex vitamins tablet Take 1 tablet by mouth daily.   buPROPion 300 MG 24 hr tablet Commonly known as: WELLBUTRIN XL TAKE 1 TABLET EVERY DAY   carisoprodol 350 MG tablet Commonly known as: SOMA Take 1 tablet (350 mg total) by mouth 3 (three) times daily as needed for muscle spasms. This is a 90 days supply   escitalopram 20 MG tablet Commonly known as: LEXAPRO TAKE 1 TABLET EVERY DAY   ezetimibe 10 MG tablet Commonly known as: ZETIA Take 1 tablet (10 mg  total) by mouth daily.   HYDROmorphone 4 MG tablet Commonly known as: DILAUDID Take 4 mg by mouth every 6 (six) hours as needed for severe pain. Dr. Carnella [place]   hydrOXYzine 25 MG tablet Commonly known as: ATARAX TAKE 1 TABLET TWICE DAILY AS NEEDED   naproxen 500 MG tablet Commonly known as: NAPROSYN Take by mouth.   sildenafil 100 MG tablet Commonly known as: Viagra Take 0.5-1 tablets (50-100 mg total) by mouth daily as needed for erectile dysfunction.   traZODone 100 MG tablet Commonly known as: DESYREL TAKE 2 TABLETS AT BEDTIME        History (reviewed): Past Medical History:  Diagnosis Date   Allergy    See chart   Anxiety disorder 11/29/2016   Arthritis    Centrilobular emphysema ([place]) 01/07/2020   Crohn's disease ([place])    Depression, recurrent ([place]) 03/05/2014   GERD (gastroesophageal reflux disease)    occasionally has indigestion   Hyperlipidemia    Multiple sclerosis (HCC)    DX 3-4 YRS AGO-- ASYMPTOMATIC   Neuromuscular disorder ([place])    MS  ON NO MEDS    Ulcerative colitis ([place])    LAST FLAREUP SPRING 2014   Past Surgical History:  Procedure Laterality Date   BACK SURGERY     CERVICAL SPINE SURGERY     FUSION   ELBOW SURGERY     LEFT   MAXIMUM ACCESS (MAS)POSTERIOR LUMBAR INTERBODY FUSION (PLIF) 1 LEVEL N/A 07/11/2013   Procedure: FOR MAXIMUM ACCESS (MAS) POSTERIOR LUMBAR INTERBODY FUSION (PLIF) 1 LEVEL LUMBAR FOUR-FIVE;  Surgeon: Eustace Moore, MD;  Location: [facility];  Service: Neurosurgery;  Laterality: N/A;   SPINE SURGERY     Approximately 10 years ago   THUMB ARTHROSCOPY     LEFT   TONSILLECTOMY     TORN ROTATOR     LEFT SHOULDER   Family History  Problem Relation Age of Onset   Arthritis Mother    Cancer Father    Arthritis Father    Social History   Socioeconomic History   Marital status: Divorced    Spouse name: Not on file   Number of children: 2   Years of education: 14   Highest education level: Associate degree:  academic program  Occupational History   Occupation: Psychiatric nurse    Comment: retired  Tobacco Use   Smoking status: Former    Packs/day: 1.00    Years: 30.00    Total pack years: 30.00    Types: Cigarettes, E-cigarettes    Quit date: 05/16/2013    Years since quitting: 9.1   Smokeless tobacco: Former  Scientific laboratory technician Use: Every day   Substances: Flavoring  Substance and Sexual Activity   Alcohol use: Never   Drug use: Never   Sexual activity: Not Currently    Birth control/protection: Abstinence  Other Topics Concern   Not on file  Social History Narrative   Lives alone. He has 2 children. He enjoys playing drums and ride his motorcycle.    Social Determinants of Health   Financial Resource Strain: Low Risk  (06/29/2022)   Overall Financial Resource Strain (CARDIA)    Difficulty of Paying Living Expenses: Not hard at all  Food Insecurity: No Food Insecurity (06/29/2022)   Hunger Vital Sign    Worried About Running Out of Food in the Last Year: Never true    Ran Out of Food in the Last Year: Never true  Transportation Needs: No Transportation Needs (06/29/2022)   PRAPARE - Hydrologist (Medical): No    Lack of Transportation (Non-Medical): No  Physical Activity: Inactive (06/29/2022)   Exercise Vital Sign    Days of Exercise per Week: 0 days    Minutes of Exercise per Session: 0 min  Stress: No Stress Concern Present (06/29/2022)   Time    Feeling of Stress : Not at all  Social Connections: Moderately Integrated (06/30/2022)   Social Connection and Isolation Panel [NHANES]    Frequency of Communication with Friends and Family: More than three times a week    Frequency of Social Gatherings with Friends and Family: Three times a week    Attends Religious Services: More than 4 times per year    Active Member of Clubs or Organizations: Yes    Attends Theatre manager Meetings: More than 4 times per year    Marital Status: Divorced    Activities of Daily Living    06/29/2022   11:18 AM  In your present state of health, do you have any difficulty performing the following activities:  Hearing? 0  Vision? 0  Difficulty concentrating or making decisions? 0  Walking or climbing stairs? 0  Dressing or bathing? 0  Doing errands, shopping? 0  Preparing Food and eating ? N  Using the Toilet? N  In the past six months, have you accidently leaked urine? N  Do you have problems with loss of bowel control? N  Managing your Medications? N  Managing your Finances? N  Housekeeping or managing your Housekeeping? N    Patient Education/ Literacy How often do you need to have someone help you when you read instructions, pamphlets, or other written materials from your doctor or pharmacy?: 1 - Never What is the last grade level you completed in school?: associates degree  Exercise Current Exercise Habits: The patient does not participate in regular exercise at present, Exercise limited by: None identified  Diet Patient reports consuming 3 meals a day and 1 snack(s) a day Patient reports that his primary diet is: Regular Patient reports that she does have regular access to food.   Depression Screen    06/30/2022    9:49 AM 04/11/2022    7:49 AM 12/01/2021    9:36 AM 06/18/2021    2:19 PM 06/03/2021   10:03 AM 03/09/2021   10:09 AM 12/01/2020   10:04 AM  PHQ 2/9 Scores  PHQ - 2 Score 0 0 0 0 0 0 0  PHQ- 9 Score  0 0 0 0 0 0     Fall Risk    06/30/2022    9:49 AM 06/29/2022   11:18 AM 04/11/2022    7:48 AM 12/01/2021    9:36 AM 12/01/2021    9:25 AM  Fall Risk  Falls in the past year? 0 0 0 0 0  Number falls in past yr: 0 0 0 0 0  Injury with Fall? 0 0 0 0 0  Risk for fall due to : No Fall Risks  No Fall Risks  No Fall Risks  Follow up Falls evaluation completed  Falls evaluation completed  Falls prevention discussed     Objective:   PHAN NAZARYAN seemed alert and oriented and he participated appropriately during our telephone visit.  Blood Pressure Weight BMI  BP Readings from Last 3 Encounters:  06/13/22 117/73  04/11/22 (!) 108/53  12/01/21 (!) 96/53   Wt Readings from Last 3 Encounters:  06/13/22 206 lb 1.9 oz (93.5 kg)  04/11/22 205 lb (93 kg)  12/01/21 190 lb (86.2 kg)   BMI Readings from Last 1 Encounters:  06/13/22 29.58 kg/m    *Unable to obtain current vital signs, weight, and BMI due to telephone visit type  Hearing/Vision  Vivian did not seem to have difficulty with hearing/understanding during the telephone conversation Reports that he has had a formal eye exam by an eye care professional within the past year Reports that he has not had a formal hearing evaluation within the past year *Unable to fully assess hearing and vision during telephone visit type  Cognitive Function:    06/30/2022    9:54 AM 06/18/2021    2:24 PM 02/03/2020    1:24 PM 01/28/2019    2:08 PM  6CIT Screen  What Year? 0 points 0 points 0 points 0 points  What month? 0 points 0 points 0 points 0 points  What time? 0 points 0 points 0 points 0 points  Count back from 20 0 points 0 points 0 points 0 points  Months in reverse 0 points 0 points 0 points 0 points  Repeat phrase 0 points 0 points 0 points 0 points  Total Score 0 points 0 points 0 points 0 points   (Normal:0-7, Significant for Dysfunction: >8)  Normal Cognitive Function Screening: Yes   Immunization & Health Maintenance Record Immunization History  Administered Date(s) Administered   Fluad Quad(high Dose 65+) 01/18/2019, 03/23/2020, 03/09/2021, 04/11/2022   Influenza, High Dose Seasonal PF 02/07/2018   Moderna SARS-COV2 Booster Vaccination 05/04/2020, 11/23/2020   Moderna Sars-Covid-2 Vaccination 08/04/2019, 09/04/2019, 03/23/2020   PNEUMOCOCCAL CONJUGATE-20 12/01/2020   PPD Test 02/13/2015   Pneumococcal Conjugate-13 10/17/2019   Td 06/27/2007    Tdap 05/20/2006, 09/06/2016   Zoster Recombinat (Shingrix) 10/17/2019, 11/23/2020    Health Maintenance  Topic Date Due   COVID-19 Vaccine (4 - 2023-24 season) 07/16/2022 (Originally 01/14/2022)   Hepatitis C Screening  07/01/2023 (Originally 12/06/1968)   Lung Cancer Screening  01/08/2023   Medicare Annual Wellness (AWV)  07/01/2023   DTaP/Tdap/Td (4 - Td or Tdap) 09/07/2026   COLONOSCOPY (Pts 45-18yr Insurance coverage will need to be confirmed)  04/09/2028   Pneumonia Vaccine 72 Years old  Completed   INFLUENZA VACCINE  Completed   Zoster Vaccines- Shingrix  Completed   HPV VACCINES  Aged Out       Assessment  This is a routine wellness examination for KOREST BROSH  Health Maintenance: Due or Overdue There are no preventive care reminders to display for this patient.   KTacey Heapdoes not need a referral for Community Assistance: Care Management:   no Social Work:    no Prescription Assistance:  no Nutrition/Diabetes Education:  no   Plan:  Personalized Goals  Goals Addressed  This Visit's Progress     DIET - INCREASE WATER INTAKE (pt-stated)        06/30/2022 AWV Goal: Exercise for General Health  Patient will verbalize understanding of the benefits of increased physical activity: Exercising regularly is important. It will improve your overall fitness, flexibility, and endurance. Regular exercise also will improve your overall health. It can help you control your weight, reduce stress, and improve your bone density. Over the next year, patient will increase physical activity as tolerated with a goal of at least 150 minutes of moderate physical activity per week.  You can tell that you are exercising at a moderate intensity if your heart starts beating faster and you start breathing faster but can still hold a conversation. Moderate-intensity exercise ideas include: Walking 1 mile (1.6 km) in about 15  minutes Biking Hiking Golfing Dancing Water aerobics Patient will verbalize understanding of everyday activities that increase physical activity by providing examples like the following: Yard work, such as: Sales promotion account executive Gardening Washing windows or floors Patient will be able to explain general safety guidelines for exercising:  Before you start a new exercise program, talk with your health care provider. Do not exercise so much that you hurt yourself, feel dizzy, or get very short of breath. Wear comfortable clothes and wear shoes with good support. Drink plenty of water while you exercise to prevent dehydration or heat stroke. Work out until your breathing and your heartbeat get faster.        Personalized Health Maintenance & Screening Recommendations  There are no preventive care reminders to display for this patient.  Lung Cancer Screening Recommended: Yes; scheduled for August (Low Dose CT Chest recommended if Age 67-80 years, 30 pack-year currently smoking OR have quit w/in past 15 years) Hepatitis C Screening recommended: no HIV Screening recommended: no  Advanced Directives: Written information was not prepared per patient's request.  Referrals & Orders No orders of the defined types were placed in this encounter.   Follow-up Plan Follow-up with Hali Marry, MD as planned Medicare wellness visit in one year.  Patient will access AVS on my chart.   I have personally reviewed and noted the following in the patient's chart:   Medical and social history Use of alcohol, tobacco or illicit drugs  Current medications and supplements Functional ability and status Nutritional status Physical activity Advanced directives List of other physicians Hospitalizations, surgeries, and ER visits in previous 12 months Vitals Screenings to include cognitive, depression, and  falls Referrals and appointments  In addition, I have reviewed and discussed with Tacey Heap certain preventive protocols, quality metrics, and best practice recommendations. A written personalized care plan for preventive services as well as general preventive health recommendations is available and can be mailed to the patient at his request.      Tinnie Gens, RN BSN  06/30/2022

## 2022-08-20 ENCOUNTER — Other Ambulatory Visit: Payer: Self-pay | Admitting: Family Medicine

## 2022-08-20 DIAGNOSIS — F329 Major depressive disorder, single episode, unspecified: Secondary | ICD-10-CM

## 2022-08-31 ENCOUNTER — Encounter: Payer: Self-pay | Admitting: *Deleted

## 2022-09-08 ENCOUNTER — Telehealth: Payer: Self-pay | Admitting: Family Medicine

## 2022-09-08 DIAGNOSIS — R3912 Poor urinary stream: Secondary | ICD-10-CM

## 2022-09-08 NOTE — Telephone Encounter (Signed)
Patient advised. Labs ordered.  

## 2022-09-08 NOTE — Telephone Encounter (Signed)
Patient is requesting a PSA says his stream is getting weaker

## 2022-09-08 NOTE — Telephone Encounter (Signed)
K for PSA.  Though this is rarely the cause of decreased urinary stream.  So if his PSA is normal then I would highly recommend that he make an appointment either here in our office or with urology which ever he prefers.

## 2022-09-10 LAB — HEPATIC FUNCTION PANEL
ALT: 17 IU/L (ref 0–44)
AST: 42 IU/L — ABNORMAL HIGH (ref 0–40)
Albumin: 4.6 g/dL (ref 3.9–4.9)
Alkaline Phosphatase: 80 IU/L (ref 44–121)
Bilirubin Total: 0.4 mg/dL (ref 0.0–1.2)
Bilirubin, Direct: 0.18 mg/dL (ref 0.00–0.40)
Total Protein: 7.2 g/dL (ref 6.0–8.5)

## 2022-09-10 LAB — PSA: PSA: 2.55 ng/mL (ref <=4.00)

## 2022-09-10 LAB — LIPID PANEL
Chol/HDL Ratio: 1.9 ratio (ref 0.0–5.0)
Cholesterol, Total: 100 mg/dL (ref 100–199)
HDL: 54 mg/dL (ref >39)
LDL Chol Calc (NIH): 35 mg/dL (ref 0–99)
Triglycerides: 43 mg/dL (ref 0–149)
VLDL Cholesterol Cal: 11 mg/dL (ref 5–40)

## 2022-09-12 NOTE — Progress Notes (Signed)
Your lab work is within acceptable range and there are no concerning findings.   ?

## 2022-09-15 ENCOUNTER — Encounter: Payer: Self-pay | Admitting: *Deleted

## 2022-10-11 ENCOUNTER — Ambulatory Visit (INDEPENDENT_AMBULATORY_CARE_PROVIDER_SITE_OTHER): Payer: Medicare Other | Admitting: Family Medicine

## 2022-10-11 ENCOUNTER — Encounter: Payer: Self-pay | Admitting: Family Medicine

## 2022-10-11 VITALS — BP 101/50 | HR 59 | Ht 70.0 in | Wt 191.0 lb

## 2022-10-11 DIAGNOSIS — L918 Other hypertrophic disorders of the skin: Secondary | ICD-10-CM | POA: Diagnosis not present

## 2022-10-11 DIAGNOSIS — F339 Major depressive disorder, recurrent, unspecified: Secondary | ICD-10-CM | POA: Diagnosis not present

## 2022-10-11 DIAGNOSIS — I700 Atherosclerosis of aorta: Secondary | ICD-10-CM | POA: Diagnosis not present

## 2022-10-11 DIAGNOSIS — Z981 Arthrodesis status: Secondary | ICD-10-CM | POA: Diagnosis not present

## 2022-10-11 NOTE — Assessment & Plan Note (Signed)
He still follows with cardiology takes a daily statin and recently Zetia was added.  He has been tolerating it well.

## 2022-10-11 NOTE — Assessment & Plan Note (Signed)
Need to reach out to [facility] [place] [facility] in regard to the [place].  Is like it is helpful and works better than any other muscle relaxer that he has tried.

## 2022-10-11 NOTE — Assessment & Plan Note (Signed)
Well on current regimen.  PHQ-9 score of 0.  Follows with psychiatry for medication management.

## 2022-10-11 NOTE — Progress Notes (Signed)
Established Patient Office Visit  Subjective   Patient ID: Paul Fowler, male    DOB: 01-May-1951  Age: 72 y.o. MRN: [id number]  Chief Complaint  Patient presents with   mood    HPI  He currently goes to pain management at [facility].  He sees Gregary Cromer there.  He says he did discuss with them about them taking over the [place] but they recommended that it come from [place].  I had encouraged him to reach out to them because it really is part of his pain management treatment and if they feel it is appropriate then it should come from their office.  We have not received any notes from their office about his care.  He has had 2 surgeries with Dr. Marikay Alar for his cervical spine.  Mood has been stable he has not had any concerns denies any depressive or anxiety symptoms currently.  He is on Abilify, Lexapro, Wellbutrin.  He also has a skin tag near his posterior left knee that he would like removed.  It gets irritated and bleeds.     ROS    Objective:     BP (!) 101/50   Pulse (!) 59   Ht 5\' 10"  (1.778 m)   Wt 191 lb (86.6 kg)   SpO2 93%   BMI 27.41 kg/m     Physical Exam Constitutional:      Appearance: He is well-developed.  HENT:     Head: Normocephalic and atraumatic.  Cardiovascular:     Rate and Rhythm: Normal rate and regular rhythm.     Heart sounds: Normal heart sounds.  Pulmonary:     Effort: Pulmonary effort is normal.     Breath sounds: Normal breath sounds.  Skin:    General: Skin is warm and dry.  Neurological:     Mental Status: He is alert and oriented to person, place, and time.  Psychiatric:        Behavior: Behavior normal.      No results found for any visits on 10/11/22.     The ASCVD Risk score (Arnett DK, et al., 2019) failed to calculate for the following reasons:   The valid total cholesterol range is 130 to 320 mg/dL    Assessment & Plan:   Problem List Items Addressed This Visit       Cardiovascular and Mediastinum    Atherosclerosis of aorta [facility])    He still follows with cardiology takes a daily statin and recently Zetia was added.  He has been tolerating it well.        Other   S/P lumbar spinal fusion    Need to reach out to [facility] [place] [facility] in regard to the [place].  Is like it is helpful and works better than any other muscle relaxer that he has tried.      Depression, recurrent (HCC)    Well on current regimen.  PHQ-9 score of 0.  Follows with psychiatry for medication management.      Other Visit Diagnoses     Acquired skin tag    -  Primary       Skin Tag removal-.  Patient tolerated procedure well.  Skin Tag Removal Procedure Note Diagnosis: inflamed skin tags Location:  left leg, post lat knee Informed Consent: Discussed risks (permanent scarring, infection, pain, bleeding, bruising, redness, and recurrence of the lesion) and benefits of the procedure, as well as the alternatives. He is aware that skin  tags are benign lesions, and their removal is often not considered medically necessary. Informed consent was obtained. Preparation: The area was prepared in a standard fashion. Anesthesia: Lidocaine 1% without epinephrine Procedure Details: Iris scissors were used to perform sharp removal. Aluminum chloride was applied for hemostasis. Ointment and bandage were applied where needed. The patient tolerated the procedure well. Total number of lesions treated: 1 Plan: The patient was instructed on post-op care. Recommend OTC analgesia as needed for pain.   Return in about 6 months (around 04/13/2023) for mood.    Nani Gasser, MD

## 2022-12-21 ENCOUNTER — Other Ambulatory Visit: Payer: Self-pay | Admitting: Cardiology

## 2022-12-21 DIAGNOSIS — I2510 Atherosclerotic heart disease of native coronary artery without angina pectoris: Secondary | ICD-10-CM

## 2022-12-21 DIAGNOSIS — E7800 Pure hypercholesterolemia, unspecified: Secondary | ICD-10-CM

## 2023-01-09 ENCOUNTER — Ambulatory Visit (INDEPENDENT_AMBULATORY_CARE_PROVIDER_SITE_OTHER): Payer: Medicare Other

## 2023-01-09 DIAGNOSIS — Z87891 Personal history of nicotine dependence: Secondary | ICD-10-CM

## 2023-01-09 DIAGNOSIS — Z122 Encounter for screening for malignant neoplasm of respiratory organs: Secondary | ICD-10-CM | POA: Diagnosis not present

## 2023-01-15 ENCOUNTER — Other Ambulatory Visit: Payer: Self-pay | Admitting: Family Medicine

## 2023-01-15 DIAGNOSIS — F329 Major depressive disorder, single episode, unspecified: Secondary | ICD-10-CM

## 2023-01-17 ENCOUNTER — Other Ambulatory Visit: Payer: Self-pay | Admitting: Acute Care

## 2023-01-17 DIAGNOSIS — Z87891 Personal history of nicotine dependence: Secondary | ICD-10-CM

## 2023-01-17 DIAGNOSIS — Z122 Encounter for screening for malignant neoplasm of respiratory organs: Secondary | ICD-10-CM

## 2023-01-28 ENCOUNTER — Other Ambulatory Visit: Payer: Self-pay | Admitting: Family Medicine

## 2023-01-28 DIAGNOSIS — G4700 Insomnia, unspecified: Secondary | ICD-10-CM

## 2023-03-04 ENCOUNTER — Other Ambulatory Visit: Payer: Self-pay | Admitting: Family Medicine

## 2023-04-01 ENCOUNTER — Other Ambulatory Visit: Payer: Self-pay | Admitting: Cardiology

## 2023-04-01 DIAGNOSIS — E7800 Pure hypercholesterolemia, unspecified: Secondary | ICD-10-CM

## 2023-04-01 DIAGNOSIS — I2510 Atherosclerotic heart disease of native coronary artery without angina pectoris: Secondary | ICD-10-CM

## 2023-04-20 ENCOUNTER — Encounter: Payer: Self-pay | Admitting: Family Medicine

## 2023-04-20 ENCOUNTER — Ambulatory Visit (INDEPENDENT_AMBULATORY_CARE_PROVIDER_SITE_OTHER): Payer: Medicare Other | Admitting: Family Medicine

## 2023-04-20 VITALS — BP 121/65 | HR 63 | Resp 12 | Ht 70.0 in | Wt 201.1 lb

## 2023-04-20 DIAGNOSIS — G8929 Other chronic pain: Secondary | ICD-10-CM

## 2023-04-20 DIAGNOSIS — F419 Anxiety disorder, unspecified: Secondary | ICD-10-CM

## 2023-04-20 DIAGNOSIS — R748 Abnormal levels of other serum enzymes: Secondary | ICD-10-CM | POA: Diagnosis not present

## 2023-04-20 DIAGNOSIS — R7309 Other abnormal glucose: Secondary | ICD-10-CM

## 2023-04-20 DIAGNOSIS — I700 Atherosclerosis of aorta: Secondary | ICD-10-CM

## 2023-04-20 DIAGNOSIS — F339 Major depressive disorder, recurrent, unspecified: Secondary | ICD-10-CM

## 2023-04-20 DIAGNOSIS — M545 Low back pain, unspecified: Secondary | ICD-10-CM | POA: Diagnosis not present

## 2023-04-20 NOTE — Assessment & Plan Note (Signed)
Dr. Gregary Cromer.for pain management.

## 2023-04-20 NOTE — Assessment & Plan Note (Signed)
Can you current regimen.  We did check lipids earlier this year and they are at goal.

## 2023-04-20 NOTE — Assessment & Plan Note (Signed)
Stable

## 2023-04-20 NOTE — Assessment & Plan Note (Signed)
Continue current regimen.  No changes today.  Follow-up in 6 months.

## 2023-04-20 NOTE — Progress Notes (Addendum)
   Established Patient Office Visit  Subjective   Patient ID: Paul Fowler, male    DOB: 02-02-1951  Age: 72 y.o. MRN: [id number]  Chief Complaint  Patient presents with   PHQ9-2 Week Follow-up    Follow up for mood.    HPI  F/U Depression/Anxiety -overall he reports that he is doing pretty well.  No specific concerns he is happy with his current medication regimen currently on Abilify and Lexapro and Wellbutrin.  He still follows with pain management unfortunately his hydromorphone has been on backorder at the pharmacy so they recently switched him to hydrocodone at least temporarily he has a follow-up later this month.  Atherosclerosis of the aorta-still taking his statin regularly and daily aspirin.  Recently saw dermatology and was put on Lamisil for the toenails.  ROS    Objective:     BP 121/65   Pulse 63   Resp 12   Ht 5\' 10"  (1.778 m)   Wt 201 lb 1.9 oz (91.2 kg)   SpO2 92%   BMI 28.86 kg/m    Physical Exam Vitals and nursing note reviewed.  Constitutional:      Appearance: Normal appearance.  HENT:     Head: Normocephalic and atraumatic.  Eyes:     Conjunctiva/sclera: Conjunctivae normal.  Cardiovascular:     Rate and Rhythm: Normal rate and regular rhythm.  Pulmonary:     Effort: Pulmonary effort is normal.     Breath sounds: Normal breath sounds.  Skin:    General: Skin is warm and dry.  Neurological:     Mental Status: He is alert.  Psychiatric:        Mood and Affect: Mood normal.      No results found for any visits on 04/20/23.    The ASCVD Risk score (Arnett DK, et al., 2019) failed to calculate for the following reasons:   The valid total cholesterol range is 130 to 320 mg/dL    Assessment & Plan:   Problem List Items Addressed This Visit       Cardiovascular and Mediastinum   Atherosclerosis of aorta (HCC)    Can you current regimen.  We did check lipids earlier this year and they are at goal.        Other    Depression, recurrent (HCC) - Primary    Continue current regimen.  No changes today.  Follow-up in 6 months.      Chronic bilateral low back pain    Dr. Gregary Cromer.for pain management.          Anxiety disorder    Stable       Other Visit Diagnoses     Elevated liver enzymes       Relevant Orders   CMP14+EGFR   Hemoglobin A1c   Abnormal glucose       Relevant Orders   CMP14+EGFR   Hemoglobin A1c       Return in about 6 months (around 10/19/2023) for Mood medications and full labs. Nani Gasser, MD

## 2023-04-21 ENCOUNTER — Other Ambulatory Visit: Payer: Self-pay | Admitting: *Deleted

## 2023-04-21 DIAGNOSIS — E871 Hypo-osmolality and hyponatremia: Secondary | ICD-10-CM

## 2023-04-21 LAB — CMP14+EGFR
ALT: 13 [IU]/L (ref 0–44)
AST: 21 [IU]/L (ref 0–40)
Albumin: 4 g/dL (ref 3.8–4.8)
Alkaline Phosphatase: 83 [IU]/L (ref 44–121)
BUN/Creatinine Ratio: 6 — ABNORMAL LOW (ref 10–24)
BUN: 7 mg/dL — ABNORMAL LOW (ref 8–27)
Bilirubin Total: 0.5 mg/dL (ref 0.0–1.2)
CO2: 25 mmol/L (ref 20–29)
Calcium: 10 mg/dL (ref 8.6–10.2)
Chloride: 93 mmol/L — ABNORMAL LOW (ref 96–106)
Creatinine, Ser: 1.21 mg/dL (ref 0.76–1.27)
Globulin, Total: 2.7 g/dL (ref 1.5–4.5)
Glucose: 113 mg/dL — ABNORMAL HIGH (ref 70–99)
Potassium: 3.9 mmol/L (ref 3.5–5.2)
Sodium: 131 mmol/L — ABNORMAL LOW (ref 134–144)
Total Protein: 6.7 g/dL (ref 6.0–8.5)
eGFR: 64 mL/min/{1.73_m2} (ref >59)

## 2023-04-21 LAB — HEMOGLOBIN A1C
Est. average glucose Bld gHb Est-mCnc: 137 mg/dL
Hgb A1c MFr Bld: 6.4 % — ABNORMAL HIGH (ref 4.8–5.6)

## 2023-04-21 NOTE — Progress Notes (Signed)
Hi Paul Fowler, Your sodium is down again.  Similar to about a year ago.  I know I did not ask yesterday but are you consuming alcohol?  That is usually the culprit for the low sodium.  If not then I would like to recheck it in a couple of weeks.  Liver function looks good.  A1c is 6.4 which is in the prediabetes range and just 1/10 of a point away from being full-blown diabetes so just really when she to work on cutting back on sweets and carbs.  That would includes breads, rice, pasta, crackers etc.

## 2023-05-19 ENCOUNTER — Other Ambulatory Visit: Payer: Self-pay | Admitting: Cardiology

## 2023-05-19 DIAGNOSIS — I2510 Atherosclerotic heart disease of native coronary artery without angina pectoris: Secondary | ICD-10-CM

## 2023-05-19 DIAGNOSIS — E7800 Pure hypercholesterolemia, unspecified: Secondary | ICD-10-CM

## 2023-05-25 LAB — BMP8+EGFR
BUN/Creatinine Ratio: 8 — ABNORMAL LOW (ref 10–24)
BUN: 10 mg/dL (ref 8–27)
CO2: 28 mmol/L (ref 20–29)
Calcium: 9.4 mg/dL (ref 8.6–10.2)
Chloride: 102 mmol/L (ref 96–106)
Creatinine, Ser: 1.2 mg/dL (ref 0.76–1.27)
Glucose: 89 mg/dL (ref 70–99)
Potassium: 4.2 mmol/L (ref 3.5–5.2)
Sodium: 143 mmol/L (ref 134–144)
eGFR: 65 mL/min/{1.73_m2} (ref >59)

## 2023-06-10 ENCOUNTER — Other Ambulatory Visit: Payer: Self-pay | Admitting: Cardiology

## 2023-06-10 DIAGNOSIS — I2510 Atherosclerotic heart disease of native coronary artery without angina pectoris: Secondary | ICD-10-CM

## 2023-06-10 DIAGNOSIS — E7800 Pure hypercholesterolemia, unspecified: Secondary | ICD-10-CM

## 2023-07-04 ENCOUNTER — Ambulatory Visit (INDEPENDENT_AMBULATORY_CARE_PROVIDER_SITE_OTHER): Payer: Medicare Other

## 2023-07-04 VITALS — Ht 70.0 in | Wt 200.0 lb

## 2023-07-04 DIAGNOSIS — Z0000 Encounter for general adult medical examination without abnormal findings: Secondary | ICD-10-CM

## 2023-07-04 NOTE — Patient Instructions (Signed)
Paul Fowler , Thank you for taking time to come for your Medicare Wellness Visit. I appreciate your ongoing commitment to your health goals. Please review the following plan we discussed and let me know if I can assist you in the future.   These are the goals we discussed:  Goals       Activity and Exercise Increased      He would like to lose weight and exercise more.       DIET - INCREASE WATER INTAKE (pt-stated)      06/30/2022 AWV Goal: Exercise for General Health  Patient will verbalize understanding of the benefits of increased physical activity: Exercising regularly is important. It will improve your overall fitness, flexibility, and endurance. Regular exercise also will improve your overall health. It can help you control your weight, reduce stress, and improve your bone density. Over the next year, patient will increase physical activity as tolerated with a goal of at least 150 minutes of moderate physical activity per week.  You can tell that you are exercising at a moderate intensity if your heart starts beating faster and you start breathing faster but can still hold a conversation. Moderate-intensity exercise ideas include: Walking 1 mile (1.6 km) in about 15 minutes Biking Hiking Golfing Dancing Water aerobics Patient will verbalize understanding of everyday activities that increase physical activity by providing examples like the following: Yard work, such as: Insurance underwriter Gardening Washing windows or floors Patient will be able to explain general safety guidelines for exercising:  Before you start a new exercise program, talk with your health care provider. Do not exercise so much that you hurt yourself, feel dizzy, or get very short of breath. Wear comfortable clothes and wear shoes with good support. Drink plenty of water while you exercise to prevent dehydration or heat  stroke. Work out until your breathing and your heartbeat get faster.       Exercise 3x per week (30 min per time)      Exercise at least 3 times a week for 30 minutes at a time      Patient Stated      06/18/2021 AWV Goal: Exercise for General Health  Patient will verbalize understanding of the benefits of increased physical activity: Exercising regularly is important. It will improve your overall fitness, flexibility, and endurance. Regular exercise also will improve your overall health. It can help you control your weight, reduce stress, and improve your bone density. Over the next year, patient will increase physical activity as tolerated with a goal of at least 150 minutes of moderate physical activity per week.  You can tell that you are exercising at a moderate intensity if your heart starts beating faster and you start breathing faster but can still hold a conversation. Moderate-intensity exercise ideas include: Walking 1 mile (1.6 km) in about 15 minutes Biking Hiking Golfing Dancing Water aerobics Patient will verbalize understanding of everyday activities that increase physical activity by providing examples like the following: Yard work, such as: Insurance underwriter Gardening Washing windows or floors Patient will be able to explain general safety guidelines for exercising:  Before you start a new exercise program, talk with your health care provider. Do not exercise so much that you hurt yourself, feel dizzy, or get very short of breath. Wear comfortable clothes and wear shoes with  good support. Drink plenty of water while you exercise to prevent dehydration or heat stroke. Work out until your breathing and your heartbeat get faster.       Walk for longer period of time and more often (pt-stated)        This is a list of the screening recommended for you and due dates:  Health  Maintenance  Topic Date Due   Hepatitis C Screening  Never done   COVID-19 Vaccine (4 - 2024-25 season) 01/15/2023   Screening for Lung Cancer  01/09/2024   Medicare Annual Wellness Visit  07/03/2024   DTaP/Tdap/Td vaccine (4 - Td or Tdap) 09/07/2026   Colon Cancer Screening  11/07/2031   Pneumonia Vaccine  Completed   Flu Shot  Completed   Zoster (Shingles) Vaccine  Completed   HPV Vaccine  Aged Out

## 2023-07-04 NOTE — Progress Notes (Signed)
Subjective:   MADIX BLOWE is a 73 y.o. male who presents for Medicare Annual/Subsequent preventive examination.  Visit Complete: Virtual I connected with  Ledell Noss on 07/04/23 by a audio enabled telemedicine application and verified that I am speaking with the correct person using two identifiers.  Patient Location: Home  Provider Location: Office/Clinic  I discussed the limitations of evaluation and management by telemedicine. The patient expressed understanding and agreed to proceed.  Vital Signs: Because this visit was a virtual/telehealth visit, some criteria may be missing or patient reported. Any vitals not documented were not able to be obtained and vitals that have been documented are patient reported.  Patient Medicare AWV questionnaire was completed by the patient on 07/01/2023; I have confirmed that all information answered by patient is correct and no changes since this date.  Cardiac Risk Factors include: advanced age (>3men, >68 women);male gender;dyslipidemia;smoking/ tobacco exposure;sedentary lifestyle     Objective:    Today's Vitals   07/04/23 0957  Weight: 200 lb (90.7 kg)  Height: 5\' 10"  (1.778 m)   Body mass index is 28.7 kg/m.     07/04/2023   10:05 AM 06/30/2022    9:54 AM 06/18/2021    2:19 PM 02/03/2020    1:21 PM 01/28/2019    2:06 PM 09/30/2018   10:58 AM 05/02/2017   11:08 AM  Advanced Directives  Does Patient Have a Medical Advance Directive? Yes Yes Yes Yes Yes Yes Yes  Type of Estate agent of [place];Living will Living will Living will;Healthcare Power of Attorney Living will Healthcare Power of [place];Living will Living will Healthcare Power of [place];Living will  Does patient want to make changes to medical advance directive? No - Patient declined No - Patient declined  No - Patient declined No - Patient declined No - Patient declined   Copy of Healthcare Power of Attorney in Chart? No - copy requested  No -  copy requested  No - copy requested  No - copy requested    Current Medications (verified) Outpatient Encounter Medications as of 07/04/2023  Medication Sig   Adalimumab 40 MG/0.4ML PSKT Inject 0.4 mLs into the skin every 7 (seven) days.   ARIPiprazole (ABILIFY) 5 MG tablet TAKE 1 TABLET EVERY DAY   aspirin EC 81 MG tablet Take 1 tablet (81 mg total) by mouth daily. Swallow whole.   atorvastatin (LIPITOR) 80 MG tablet TAKE 1 TABLET EVERY DAY (NEED MD APPOINTMENT)   buPROPion (WELLBUTRIN XL) 300 MG 24 hr tablet TAKE 1 TABLET EVERY DAY   escitalopram (LEXAPRO) 20 MG tablet TAKE 1 TABLET EVERY DAY   ezetimibe (ZETIA) 10 MG tablet Take 1 tablet (10 mg total) by mouth daily.   HYDROmorphone (DILAUDID) 4 MG tablet Take 4 mg by mouth every 6 (six) hours as needed for severe pain. Dr. Aggie Moats   hydrOXYzine (ATARAX) 25 MG tablet TAKE 1 TABLET TWICE DAILY AS NEEDED   naproxen (NAPROSYN) 500 MG tablet Take by mouth.   sildenafil (VIAGRA) 100 MG tablet Take 0.5-1 tablets (50-100 mg total) by mouth daily as needed for erectile dysfunction.   traZODone (DESYREL) 100 MG tablet TAKE 2 TABLETS AT BEDTIME   b complex vitamins tablet Take 1 tablet by mouth daily. (Patient not taking: Reported on 07/04/2023)   carisoprodol (SOMA) 350 MG tablet Take 1 tablet (350 mg total) by mouth 3 (three) times daily as needed for muscle spasms. This is a 90 days supply (Patient not taking: Reported on 07/04/2023)  No facility-administered encounter medications on file as of 07/04/2023.    Allergies (verified) Buspirone, Diclofenac, Interferon beta-1a, Venlafaxine, Methocarbamol, and Saccharin   History: Past Medical History:  Diagnosis Date   Allergy    See chart   Anxiety disorder 11/29/2016   Arthritis    Centrilobular emphysema (HCC) 01/07/2020   Crohn's disease (HCC)    Depression, recurrent (HCC) 03/05/2014   GERD (gastroesophageal reflux disease)    occasionally has indigestion   Hyperlipidemia     Multiple sclerosis (HCC)    DX 3-4 YRS AGO-- ASYMPTOMATIC   Neuromuscular disorder (HCC)    MS  ON NO MEDS    Ulcerative colitis (HCC)    LAST FLAREUP SPRING 2014   Past Surgical History:  Procedure Laterality Date   BACK SURGERY     CERVICAL SPINE SURGERY     FUSION   ELBOW SURGERY     LEFT   MAXIMUM ACCESS (MAS)POSTERIOR LUMBAR INTERBODY FUSION (PLIF) 1 LEVEL N/A 07/11/2013   Procedure: FOR MAXIMUM ACCESS (MAS) POSTERIOR LUMBAR INTERBODY FUSION (PLIF) 1 LEVEL LUMBAR FOUR-FIVE;  Surgeon: Tia Alert, MD;  Location: [facility];  Service: Neurosurgery;  Laterality: N/A;   SPINE SURGERY     Approximately 10 years ago   THUMB ARTHROSCOPY     LEFT   TONSILLECTOMY     TORN ROTATOR     LEFT SHOULDER   Family History  Problem Relation Age of Onset   Arthritis Mother    Cancer Father    Arthritis Father    Social History   Socioeconomic History   Marital status: Divorced    Spouse name: Not on file   Number of children: 2   Years of education: 14   Highest education level: Associate degree: occupational, Scientist, product/process development, or vocational program  Occupational History   Occupation: Journalist, newspaper    Comment: retired  Tobacco Use   Smoking status: Former    Current packs/day: 0.00    Average packs/day: 1 pack/day for 30.0 years (30.0 ttl pk-yrs)    Types: Cigarettes, E-cigarettes    Start date: 05/17/1983    Quit date: 05/16/2013    Years since quitting: 10.1   Smokeless tobacco: Former  Building services engineer status: Every Day   Substances: Flavoring  Substance and Sexual Activity   Alcohol use: Never   Drug use: Never   Sexual activity: Not Currently    Birth control/protection: Abstinence  Other Topics Concern   Not on file  Social History Narrative   Lives alone. He has 2 children. He enjoys playing drums and ride his motorcycle.    Social Drivers of Corporate investment banker Strain: Low Risk  (07/04/2023)   Overall Financial Resource Strain (CARDIA)     Difficulty of Paying Living Expenses: Not hard at all  Food Insecurity: No Food Insecurity (07/04/2023)   Hunger Vital Sign    Worried About Running Out of Food in the Last Year: Never true    Ran Out of Food in the Last Year: Never true  Transportation Needs: No Transportation Needs (07/04/2023)   PRAPARE - Administrator, Civil Service (Medical): No    Lack of Transportation (Non-Medical): No  Physical Activity: Insufficiently Active (07/04/2023)   Exercise Vital Sign    Days of Exercise per Week: 3 days    Minutes of Exercise per Session: 20 min  Stress: No Stress Concern Present (07/04/2023)   [employer] of Occupational Health - Occupational Stress  Questionnaire    Feeling of Stress : Not at all  Social Connections: Moderately Integrated (07/04/2023)   Social Connection and Isolation Panel [NHANES]    Frequency of Communication with Friends and Family: Three times a week    Frequency of Social Gatherings with Friends and Family: Twice a week    Attends Religious Services: More than 4 times per year    Active Member of [employer] or Organizations: Yes    Attends Engineer, structural: More than 4 times per year    Marital Status: Divorced    Tobacco Counseling Counseling given: Not Answered   Clinical Intake:  Pre-visit preparation completed: Yes  Pain : No/denies pain     BMI - recorded: 28.7 Nutritional Status: BMI 25 -29 Overweight Nutritional Risks: None Diabetes: No  How often do you need to have someone help you when you read instructions, pamphlets, or other written materials from your doctor or pharmacy?: 1 - Never What is the last grade level you completed in school?: 14  Interpreter Needed?: No      Activities of Daily Living    07/04/2023    9:59 AM 07/03/2023   12:12 PM  In your present state of health, do you have any difficulty performing the following activities:  Hearing? 0 0  Vision? 0 0  Difficulty concentrating or making  decisions? 0 0  Walking or climbing stairs? 0 0  Dressing or bathing? 0 0  Doing errands, shopping? 0 0  Preparing Food and eating ? N N  Using the Toilet? N N  In the past six months, have you accidently leaked urine? N N  Do you have problems with loss of bowel control? N N  Managing your Medications? N N  Managing your Finances? N N  Housekeeping or managing your Housekeeping? N N    Patient Care Team: Agapito Games, MD as PCP - General (Family Medicine) Marney Setting, MD as Referring Physician (Gastroenterology) Burtis Junes, MD as Referring Physician (Pain Medicine)  Indicate any recent Medical Services you may have received from other than [facility] providers in the past year (date may be approximate).     Assessment:   This is a routine wellness examination for [place].  Hearing/Vision screen Hearing Screening - Comments:: Unable to check Vision Screening - Comments:: Unable to check   Goals Addressed             This Visit's Progress    Activity and Exercise Increased       He would like to lose weight and exercise more.       Depression Screen    04/20/2023    8:23 AM 10/11/2022    8:35 AM 06/30/2022    9:49 AM 04/11/2022    7:49 AM 12/01/2021    9:36 AM 06/18/2021    2:19 PM 06/03/2021   10:03 AM  PHQ 2/9 Scores  PHQ - 2 Score 0 0 0 0 0 0 0  PHQ- 9 Score 0 0  0 0 0 0    Fall Risk    07/04/2023   10:06 AM 07/03/2023   12:12 PM 04/20/2023    8:27 AM 10/11/2022    8:34 AM 06/30/2022    9:49 AM  Fall Risk   Falls in the past year? 0 1 0 0 0  Number falls in past yr: 0 0 0 0 0  Injury with Fall? 0 0 0 0 0  Risk for fall due to : No Fall  Risks   No Fall Risks No Fall Risks  Follow up Falls evaluation completed   Falls evaluation completed Falls evaluation completed    MEDICARE RISK AT HOME: Medicare Risk at Home Any stairs in or around the home?: Yes If so, are there any without handrails?: No Home free of loose throw rugs in walkways, pet beds,  electrical cords, etc?: Yes Adequate lighting in your home to reduce risk of falls?: Yes Life alert?: No Use of a cane, walker or w/c?: No Grab bars in the bathroom?: No Shower chair or bench in shower?: No Elevated toilet seat or a handicapped toilet?: No  TIMED UP AND GO:  Was the test performed?  No    Cognitive Function:        07/04/2023   10:06 AM 06/30/2022    9:54 AM 06/18/2021    2:24 PM 02/03/2020    1:24 PM 01/28/2019    2:08 PM  6CIT Screen  What Year? 0 points 0 points 0 points 0 points 0 points  What month?  0 points 0 points 0 points 0 points  What time? 0 points 0 points 0 points 0 points 0 points  Count back from 20 0 points 0 points 0 points 0 points 0 points  Months in reverse 0 points 0 points 0 points 0 points 0 points  Repeat phrase 0 points 0 points 0 points 0 points 0 points  Total Score  0 points 0 points 0 points 0 points    Immunizations Immunization History  Administered Date(s) Administered   Fluad Quad(high Dose 65+) 01/18/2019, 03/23/2020, 03/09/2021, 04/11/2022   Influenza, High Dose Seasonal PF 02/07/2018   Influenza-Unspecified 02/14/2023   Moderna SARS-COV2 Booster Vaccination 05/04/2020, 11/23/2020   Moderna Sars-Covid-2 Vaccination 08/04/2019, 09/04/2019, 03/23/2020   PNEUMOCOCCAL CONJUGATE-20 12/01/2020   PPD Test 02/13/2015   Pneumococcal Conjugate-13 10/17/2019   Td 06/27/2007   Tdap 05/20/2006, 09/06/2016   Zoster Recombinant(Shingrix) 10/17/2019, 11/23/2020    TDAP status: Up to date  Flu Vaccine status: Up to date  Pneumococcal vaccine status: Up to date  Covid-19 vaccine status: Completed vaccines  Qualifies for Shingles Vaccine? Yes   Zostavax completed No   Shingrix Completed?: Yes  Screening Tests Health Maintenance  Topic Date Due   Hepatitis C Screening  Never done   COVID-19 Vaccine (4 - 2024-25 season) 01/15/2023   Lung Cancer Screening  01/09/2024   Medicare Annual Wellness (AWV)  07/03/2024    DTaP/Tdap/Td (4 - Td or Tdap) 09/07/2026   Colonoscopy  11/07/2031   Pneumonia Vaccine 48+ Years old  Completed   INFLUENZA VACCINE  Completed   Zoster Vaccines- Shingrix  Completed   HPV VACCINES  Aged Out    Health Maintenance  Health Maintenance Due  Topic Date Due   Hepatitis C Screening  Never done   COVID-19 Vaccine (4 - 2024-25 season) 01/15/2023    Colorectal cancer screening: Type of screening: Colonoscopy. Completed 11/06/2021. Repeat every 10 years  Lung Cancer Screening: (Low Dose CT Chest recommended if Age 35-80 years, 20 pack-year currently smoking OR have quit w/in 15years.) does not qualify.   Lung Cancer Screening Referral: currently in the lung screening program  Additional Screening:  Hepatitis C Screening: does qualify; Completed Can complete next visit.   Vision Screening: Recommended annual ophthalmology exams for early detection of glaucoma and other disorders of the eye. Is the patient up to date with their annual eye exam?  Yes  Who is the provider or what is  the name of the office in which the patient attends annual eye exams? [facility] If pt is not established with a provider, would they like to be referred to a provider to establish care?  N/a .   Dental Screening: Recommended annual dental exams for proper oral hygiene   Community Resource Referral / Chronic Care Management: CRR required this visit?  No   CCM required this visit?  No     Plan:     I have personally reviewed and noted the following in the patient's chart:   Medical and social history Use of alcohol, tobacco or illicit drugs  Current medications and supplements including opioid prescriptions. Patient is currently taking opioid prescriptions. Information provided to patient regarding non-opioid alternatives. Patient advised to discuss non-opioid treatment plan with their provider. Functional ability and status Nutritional status Physical activity Advanced  directives List of other physicians Hospitalizations, surgeries, and ER visits in previous 12 months Vitals Screenings to include cognitive, depression, and falls Referrals and appointments  In addition, I have reviewed and discussed with patient certain preventive protocols, quality metrics, and best practice recommendations. A written personalized care plan for preventive services as well as general preventive health recommendations were provided to patient.     Esmond Harps, CMA   07/04/2023   After Visit Summary: (MyChart) Due to this being a telephonic visit, the after visit summary with patients personalized plan was offered to patient via MyChart   Nurse Notes:   ZOHAIR EPP is a 73 y.o. male patient of Metheney, Barbarann Ehlers, MD who had a Medicare Annual Wellness Visit today via telephone. Anel is Retired and lives alone. he has 2 children. He reports that he is socially active and does interact with friends/family regularly. He is minimally physically active and enjoys playing drums and ride his motorcycle. He plays pickle ball when the weather is nice.   Recommend continuing with yearly lung screening.

## 2023-08-04 ENCOUNTER — Other Ambulatory Visit: Payer: Self-pay | Admitting: Cardiology

## 2023-08-04 DIAGNOSIS — E7800 Pure hypercholesterolemia, unspecified: Secondary | ICD-10-CM

## 2023-08-04 DIAGNOSIS — I2510 Atherosclerotic heart disease of native coronary artery without angina pectoris: Secondary | ICD-10-CM

## 2023-09-11 NOTE — Progress Notes (Signed)
 HPI: FU coronary artery disease. Abdominal ultrasound August 2021 showed no aneurysm. Chest CT August 2022 showed aortic atherosclerosis and coronary calcification including left main. There was also note of emphysema and mild calcification of the aortic valve. Nuclear study October 2022 showed ejection fraction 52% and no ischemia.  Since last seen,   Current Outpatient Medications  Medication Sig Dispense Refill   Adalimumab 40 MG/0.4ML PSKT Inject 0.4 mLs into the skin every 7 (seven) days.     ARIPiprazole  (ABILIFY ) 5 MG tablet TAKE 1 TABLET EVERY DAY 90 tablet 3   aspirin  EC 81 MG tablet Take 1 tablet (81 mg total) by mouth daily. Swallow whole. 90 tablet 3   atorvastatin  (LIPITOR) 80 MG tablet TAKE 1 TABLET EVERY DAY (NEED MD APPOINTMENT) 90 tablet 1   b complex vitamins tablet Take 1 tablet by mouth daily.     buPROPion  (WELLBUTRIN  XL) 300 MG 24 hr tablet TAKE 1 TABLET EVERY DAY 90 tablet 3   carisoprodol  (SOMA ) 350 MG tablet Take 1 tablet (350 mg total) by mouth 3 (three) times daily as needed for muscle spasms. This is a 90 days supply 90 tablet 0   escitalopram  (LEXAPRO ) 20 MG tablet TAKE 1 TABLET EVERY DAY 90 tablet 3   ezetimibe  (ZETIA ) 10 MG tablet TAKE 1 TABLET EVERY DAY 90 tablet 0   HYDROmorphone  (DILAUDID ) 4 MG tablet Take 4 mg by mouth every 6 (six) hours as needed for severe pain. Dr. T. Garvin     hydrOXYzine  (ATARAX ) 25 MG tablet TAKE 1 TABLET TWICE DAILY AS NEEDED 180 tablet 3   sildenafil  (VIAGRA ) 100 MG tablet Take 0.5-1 tablets (50-100 mg total) by mouth daily as needed for erectile dysfunction. 15 tablet 3   traZODone  (DESYREL ) 100 MG tablet TAKE 2 TABLETS AT BEDTIME 180 tablet 3   No current facility-administered medications for this visit.     Past Medical History:  Diagnosis Date   Allergy    See chart   Anxiety disorder 11/29/2016   Arthritis    Centrilobular emphysema (HCC) 01/07/2020   Crohn's disease (HCC)    Depression, recurrent (HCC)  03/05/2014   GERD (gastroesophageal reflux disease)    occasionally has indigestion   Hyperlipidemia    Multiple sclerosis (HCC)    DX 3-4 YRS AGO-- ASYMPTOMATIC   Neuromuscular disorder (HCC)    MS  ON NO MEDS    Ulcerative colitis (HCC)    LAST FLAREUP SPRING 2014    Past Surgical History:  Procedure Laterality Date   BACK SURGERY     CERVICAL SPINE SURGERY     FUSION   ELBOW SURGERY     LEFT   MAXIMUM ACCESS (MAS)POSTERIOR LUMBAR INTERBODY FUSION (PLIF) 1 LEVEL N/A 07/11/2013   Procedure: FOR MAXIMUM ACCESS (MAS) POSTERIOR LUMBAR INTERBODY FUSION (PLIF) 1 LEVEL LUMBAR FOUR-FIVE;  Surgeon: Isadora Mar, MD;  Location: [facility];  Service: Neurosurgery;  Laterality: N/A;   SPINE SURGERY     Approximately 10 years ago   THUMB ARTHROSCOPY     LEFT   TONSILLECTOMY     TORN ROTATOR     LEFT SHOULDER    Social History   Socioeconomic History   Marital status: Divorced    Spouse name: Not on file   Number of children: 2   Years of education: 14   Highest education level: Associate degree: occupational, Scientist, product/process development, or vocational program  Occupational History   Occupation: Journalist, newspaper  Comment: retired  Tobacco Use   Smoking status: Former    Current packs/day: 0.00    Average packs/day: 1 pack/day for 30.0 years (30.0 ttl pk-yrs)    Types: Cigarettes, E-cigarettes    Start date: 05/17/1983    Quit date: 05/16/2013    Years since quitting: 10.3   Smokeless tobacco: Former  Building services engineer status: Every Day   Substances: Flavoring  Substance and Sexual Activity   Alcohol use: Never   Drug use: Never   Sexual activity: Not Currently    Birth control/protection: Abstinence  Other Topics Concern   Not on file  Social History Narrative   Lives alone. He has 2 children. He enjoys playing drums and ride his motorcycle.    Social Drivers of Corporate investment banker Strain: Low Risk  (09/18/2023)   Overall Financial Resource Strain (CARDIA)     Difficulty of Paying Living Expenses: Not hard at all  Food Insecurity: No Food Insecurity (09/18/2023)   Hunger Vital Sign    Worried About Running Out of Food in the Last Year: Never true    Ran Out of Food in the Last Year: Never true  Transportation Needs: No Transportation Needs (09/18/2023)   PRAPARE - Administrator, Civil Service (Medical): No    Lack of Transportation (Non-Medical): No  Physical Activity: Insufficiently Active (09/18/2023)   Exercise Vital Sign    Days of Exercise per Week: 1 day    Minutes of Exercise per Session: 30 min  Stress: No Stress Concern Present (09/18/2023)   [employer] of Occupational Health - Occupational Stress Questionnaire    Feeling of Stress : Not at all  Social Connections: Moderately Integrated (09/18/2023)   Social Connection and Isolation Panel [NHANES]    Frequency of Communication with Friends and Family: More than three times a week    Frequency of Social Gatherings with Friends and Family: Once a week    Attends Religious Services: More than 4 times per year    Active Member of [employer] or Organizations: Yes    Attends Banker Meetings: 1 to 4 times per year    Marital Status: Divorced  Catering manager Violence: Not At Risk (07/04/2023)   Humiliation, Afraid, Rape, and Kick questionnaire    Fear of Current or Ex-Partner: No    Emotionally Abused: No    Physically Abused: No    Sexually Abused: No    Family History  Problem Relation Age of Onset   Arthritis Mother    Cancer Father    Arthritis Father     ROS: no fevers or chills, productive cough, hemoptysis, dysphasia, odynophagia, melena, hematochezia, dysuria, hematuria, rash, seizure activity, orthopnea, PND, pedal edema, claudication. Remaining systems are negative.  Physical Exam: Well-developed well-nourished in no acute distress.  Skin is warm and dry.  HEENT is normal.  Neck is supple.  Chest is clear to auscultation with normal expansion.   Cardiovascular exam is regular rate and rhythm.  Abdominal exam nontender or distended. No masses palpated. Extremities show no edema. neuro grossly intact  EKG Interpretation Date/Time:  Wednesday Sep 20 2023 15:27:47 EDT Ventricular Rate:  68 PR Interval:  158 QRS Duration:  80 QT Interval:  404 QTC Calculation: 429 R Axis:   6  Text Interpretation: Normal sinus rhythm Non-specific ST-t changes Confirmed by Alexandria Angel ([id number]) on 09/20/2023 3:29:07 PM    A/P  1 coronary artery disease-based on previous CT demonstrating coronary calcification.  Follow-up functional study showed no ischemia and he is not having chest pain.  Continue aspirin  and statin.  2 hyperlipidemia-continue Lipitor and Zetia .  Check lipids and liver.  Alexandria Angel, MD

## 2023-09-19 ENCOUNTER — Ambulatory Visit (INDEPENDENT_AMBULATORY_CARE_PROVIDER_SITE_OTHER): Admitting: Family Medicine

## 2023-09-19 ENCOUNTER — Encounter: Payer: Self-pay | Admitting: Family Medicine

## 2023-09-19 VITALS — BP 115/66 | HR 60 | Ht 70.0 in | Wt 204.0 lb

## 2023-09-19 DIAGNOSIS — R590 Localized enlarged lymph nodes: Secondary | ICD-10-CM | POA: Diagnosis not present

## 2023-09-19 NOTE — Progress Notes (Signed)
   Acute Office Visit  Subjective:     Patient ID: Paul Fowler, male    DOB: 1950/08/20, 73 y.o.   MRN: [id number]  No chief complaint on file.   HPI Patient is in today for Lump on right side of his neck.  Feels sore . Noticed it over the weekend. No URI sxs or allergies.  No known trauma or injury.  His mom had a history of lymphoma.  No unusual fevers chills or abnormal weight loss.  ROS      Objective:    There were no vitals taken for this visit.   Physical Exam Constitutional:      Appearance: Normal appearance.  HENT:     Head: Normocephalic and atraumatic.     Right Ear: Tympanic membrane, ear canal and external ear normal. There is no impacted cerumen.     Left Ear: Tympanic membrane, ear canal and external ear normal. There is no impacted cerumen.     Nose: Nose normal.     Mouth/Throat:     Pharynx: Oropharynx is clear.  Eyes:     Conjunctiva/sclera: Conjunctivae normal.  Neck:     Comments: Feel a small mobile lymph node just lateral to the thyroid gland on the right side. Cardiovascular:     Rate and Rhythm: Normal rate and regular rhythm.  Pulmonary:     Effort: Pulmonary effort is normal.     Breath sounds: Normal breath sounds.  Musculoskeletal:     Cervical back: Neck supple. No tenderness.  Lymphadenopathy:     Cervical: Cervical adenopathy present.  Skin:    General: Skin is warm and dry.  Neurological:     Mental Status: He is alert and oriented to person, place, and time.  Psychiatric:        Mood and Affect: Mood normal.     No results found for any visits on 09/19/23.      Assessment & Plan:   Problem List Items Addressed This Visit   None Visit Diagnoses       Cervical lymphadenopathy    -  Primary   Relevant Orders   CBC with Differential/Platelet   US  Soft Tissue Head/Neck (NON-THYROID)       Since I do not have a specific reason for the lymph node to be inflamed we will go ahead and get a CBC with differential and  schedule for an ultrasound in the next couple of weeks.  It develops any new or worsening symptoms, it is getting larger the skin looks red then please let us  know.  No orders of the defined types were placed in this encounter.   No follow-ups on file.  Duaine German, MD

## 2023-09-20 ENCOUNTER — Encounter: Payer: Self-pay | Admitting: Family Medicine

## 2023-09-20 ENCOUNTER — Ambulatory Visit

## 2023-09-20 ENCOUNTER — Encounter: Payer: Self-pay | Admitting: Cardiology

## 2023-09-20 ENCOUNTER — Ambulatory Visit (INDEPENDENT_AMBULATORY_CARE_PROVIDER_SITE_OTHER): Payer: Medicare Other | Admitting: Cardiology

## 2023-09-20 VITALS — BP 118/62 | HR 68 | Ht 70.0 in | Wt 203.0 lb

## 2023-09-20 DIAGNOSIS — E7800 Pure hypercholesterolemia, unspecified: Secondary | ICD-10-CM

## 2023-09-20 DIAGNOSIS — I2510 Atherosclerotic heart disease of native coronary artery without angina pectoris: Secondary | ICD-10-CM

## 2023-09-20 DIAGNOSIS — R590 Localized enlarged lymph nodes: Secondary | ICD-10-CM

## 2023-09-20 LAB — CBC WITH DIFFERENTIAL/PLATELET
Basophils Absolute: 0.1 10*3/uL (ref 0.0–0.2)
Basos: 1 %
EOS (ABSOLUTE): 0.4 10*3/uL (ref 0.0–0.4)
Eos: 5 %
Hematocrit: 44.5 % (ref 37.5–51.0)
Hemoglobin: 14.9 g/dL (ref 13.0–17.7)
Immature Grans (Abs): 0 10*3/uL (ref 0.0–0.1)
Immature Granulocytes: 1 %
Lymphocytes Absolute: 2.7 10*3/uL (ref 0.7–3.1)
Lymphs: 32 %
MCH: 30.3 pg (ref 26.6–33.0)
MCHC: 33.5 g/dL (ref 31.5–35.7)
MCV: 91 fL (ref 79–97)
Monocytes Absolute: 0.7 10*3/uL (ref 0.1–0.9)
Monocytes: 9 %
Neutrophils Absolute: 4.6 10*3/uL (ref 1.4–7.0)
Neutrophils: 52 %
Platelets: 274 10*3/uL (ref 150–450)
RBC: 4.91 x10E6/uL (ref 4.14–5.80)
RDW: 12.9 % (ref 11.6–15.4)
WBC: 8.5 10*3/uL (ref 3.4–10.8)

## 2023-09-20 NOTE — Progress Notes (Signed)
 Your lab work is within acceptable range and there are no concerning findings.   ?

## 2023-09-20 NOTE — Patient Instructions (Signed)

## 2023-09-27 ENCOUNTER — Ambulatory Visit: Payer: Self-pay | Admitting: Family Medicine

## 2023-09-27 NOTE — Progress Notes (Signed)
 Hi Ken, ultrasound shows several lymph nodes but none of them are actually enlarged or have concerning features the largest one does measure about 1-1/2 cm.  That is probably the one that you are actually able to feel.  Since there is no concerning features at this point, plan to just keep an eye on it over the next 1 to 2 months.  If you feel like it is changing or getting larger in any way then please let me know.

## 2023-10-12 ENCOUNTER — Other Ambulatory Visit: Payer: Self-pay | Admitting: Cardiology

## 2023-10-12 DIAGNOSIS — E7800 Pure hypercholesterolemia, unspecified: Secondary | ICD-10-CM

## 2023-10-12 DIAGNOSIS — I2510 Atherosclerotic heart disease of native coronary artery without angina pectoris: Secondary | ICD-10-CM

## 2023-10-19 ENCOUNTER — Ambulatory Visit: Payer: Medicare Other | Admitting: Family Medicine

## 2023-10-23 ENCOUNTER — Ambulatory Visit (INDEPENDENT_AMBULATORY_CARE_PROVIDER_SITE_OTHER): Payer: Medicare Other | Admitting: Family Medicine

## 2023-10-23 ENCOUNTER — Other Ambulatory Visit: Payer: Self-pay | Admitting: Medical Genetics

## 2023-10-23 ENCOUNTER — Encounter: Payer: Self-pay | Admitting: Family Medicine

## 2023-10-23 VITALS — BP 113/61 | HR 71 | Ht 70.0 in | Wt 202.1 lb

## 2023-10-23 DIAGNOSIS — J432 Centrilobular emphysema: Secondary | ICD-10-CM

## 2023-10-23 DIAGNOSIS — G4700 Insomnia, unspecified: Secondary | ICD-10-CM

## 2023-10-23 DIAGNOSIS — R7301 Impaired fasting glucose: Secondary | ICD-10-CM | POA: Insufficient documentation

## 2023-10-23 DIAGNOSIS — R972 Elevated prostate specific antigen [PSA]: Secondary | ICD-10-CM

## 2023-10-23 DIAGNOSIS — F321 Major depressive disorder, single episode, moderate: Secondary | ICD-10-CM | POA: Insufficient documentation

## 2023-10-23 DIAGNOSIS — Z125 Encounter for screening for malignant neoplasm of prostate: Secondary | ICD-10-CM

## 2023-10-23 DIAGNOSIS — I700 Atherosclerosis of aorta: Secondary | ICD-10-CM

## 2023-10-23 DIAGNOSIS — F339 Major depressive disorder, recurrent, unspecified: Secondary | ICD-10-CM | POA: Diagnosis not present

## 2023-10-23 DIAGNOSIS — K50919 Crohn's disease, unspecified, with unspecified complications: Secondary | ICD-10-CM

## 2023-10-23 MED ORDER — BUPROPION HCL ER (XL) 150 MG PO TB24: 150.0000 mg | ORAL_TABLET | Freq: Every day | ORAL | 0 refills | Status: DC

## 2023-10-23 NOTE — Assessment & Plan Note (Addendum)
 Doing really well. Discussed decreasing wellbutrin  to 150mg . He is open to trying to reduce his dose.

## 2023-10-23 NOTE — Progress Notes (Signed)
 Established Patient Office Visit  Subjective  Patient ID: Paul Fowler, male    DOB: 1950-07-30  Age: 73 y.o. MRN: [id number]  No chief complaint on file.   HPI  Follow-up major depressive disorder-currently on Lexapro  20 mg  and wellbutrin  XL daily and trazodone  at night for sleep. Doing really well right now.  Just got back from trip to met old friends.  Didn't drink alcohol.   F/U atherosclerosis of aorta - currently on daily statin liiptor 80 mg.   F/U emphysema - gets yearly Lung Ca screening.  Due in August.      ROS    Objective:     BP 113/61   Pulse 71   Ht 5\' 10"  (1.778 m)   Wt 202 lb 1.9 oz (91.7 kg)   SpO2 95%   BMI 29.00 kg/m    Physical Exam Vitals and nursing note reviewed.  Constitutional:      Appearance: Normal appearance.  HENT:     Head: Normocephalic and atraumatic.  Eyes:     Conjunctiva/sclera: Conjunctivae normal.  Cardiovascular:     Rate and Rhythm: Normal rate and regular rhythm.  Pulmonary:     Effort: Pulmonary effort is normal.     Breath sounds: Normal breath sounds.  Skin:    General: Skin is warm and dry.  Neurological:     Mental Status: He is alert.  Psychiatric:        Mood and Affect: Mood normal.      No results found for any visits on 10/23/23.    The ASCVD Risk score (Arnett DK, et al., 2019) failed to calculate for the following reasons:   The valid total cholesterol range is 130 to 320 mg/dL    Assessment & Plan:   Problem List Items Addressed This Visit       Cardiovascular and Mediastinum   Atherosclerosis of aorta (HCC)   Contineu daily statin.        Relevant Orders   CMP14+EGFR   Lipid panel   CBC   Hemoglobin A1c   PSA   Vitamin B1   B12     Respiratory   Centrilobular emphysema (HCC)   No recent flares or respiratory problems.       Relevant Orders   CMP14+EGFR   Lipid panel   CBC   Hemoglobin A1c   PSA   Vitamin B1   B12     Endocrine   IFG (impaired fasting  glucose)   Due for A1C      Relevant Orders   CMP14+EGFR   Lipid panel   CBC   Hemoglobin A1c   PSA   Vitamin B1   B12     Other   Insomnia   On trazodone  for sleep      Depression, recurrent (HCC) - Primary   Doing really well. Discussed decreasing wellbutrin  to 150mg . He is open to trying to reduce his dose.       Relevant Medications   buPROPion  (WELLBUTRIN  XL) 150 MG 24 hr tablet   Current moderate episode of major depressive disorder without prior episode (HCC)   Relevant Medications   buPROPion  (WELLBUTRIN  XL) 150 MG 24 hr tablet   Crohn's disease (HCC)   Now following with Dr. Alberteen Aloe at [facility] and on Humira      Relevant Orders   CMP14+EGFR   Lipid panel   CBC   Hemoglobin A1c   PSA   Vitamin B1  B12   Other Visit Diagnoses       Screening for malignant neoplasm of prostate       Relevant Orders   PSA     Elevated PSA       Relevant Orders   PSA       Return in about 6 months (around 04/23/2024) for Pre-diabetes, Mood.    Duaine German, MD

## 2023-10-23 NOTE — Assessment & Plan Note (Signed)
 On trazodone for sleep

## 2023-10-23 NOTE — Assessment & Plan Note (Signed)
Due for A1C  

## 2023-10-23 NOTE — Assessment & Plan Note (Signed)
 Now following with Dr. Alberteen Aloe at [facility] and on Humira

## 2023-10-23 NOTE — Assessment & Plan Note (Signed)
 Contineu daily statin.

## 2023-10-23 NOTE — Assessment & Plan Note (Signed)
 No recent flares or respiratory problems.

## 2023-10-26 ENCOUNTER — Ambulatory Visit: Payer: Self-pay | Admitting: Family Medicine

## 2023-10-26 NOTE — Progress Notes (Signed)
 Hi Paul Fowler, overall metabolic panel looks okay kidney and liver function are stable A1c is 5.9 so still in the prediabetes range but it actually does look much better compared to 6 months ago so great work and making some changes.  Cholesterol looks good blood counts normal no sign of anemia or infection.  Prostate test level is 3.4.  This is fairly stable for you you tend to jump between 2-3.  Recommend repeat in 1 year. B12 looks good.  Vitamin B-1 is still pending.

## 2023-10-30 LAB — CMP14+EGFR
ALT: 13 IU/L (ref 0–44)
AST: 24 IU/L (ref 0–40)
Albumin: 4.3 g/dL (ref 3.8–4.8)
Alkaline Phosphatase: 83 IU/L (ref 44–121)
BUN/Creatinine Ratio: 7 — ABNORMAL LOW (ref 10–24)
BUN: 8 mg/dL (ref 8–27)
Bilirubin Total: 0.3 mg/dL (ref 0.0–1.2)
CO2: 23 mmol/L (ref 20–29)
Calcium: 9.2 mg/dL (ref 8.6–10.2)
Chloride: 100 mmol/L (ref 96–106)
Creatinine, Ser: 1.18 mg/dL (ref 0.76–1.27)
Globulin, Total: 2.5 g/dL (ref 1.5–4.5)
Glucose: 113 mg/dL — ABNORMAL HIGH (ref 70–99)
Potassium: 4.2 mmol/L (ref 3.5–5.2)
Sodium: 140 mmol/L (ref 134–144)
Total Protein: 6.8 g/dL (ref 6.0–8.5)
eGFR: 66 mL/min/{1.73_m2} (ref >59)

## 2023-10-30 LAB — HEMOGLOBIN A1C
Est. average glucose Bld gHb Est-mCnc: 123 mg/dL
Hgb A1c MFr Bld: 5.9 % — ABNORMAL HIGH (ref 4.8–5.6)

## 2023-10-30 LAB — CBC
Hematocrit: 42.5 % (ref 37.5–51.0)
Hemoglobin: 13.7 g/dL (ref 13.0–17.7)
MCH: 30 pg (ref 26.6–33.0)
MCHC: 32.2 g/dL (ref 31.5–35.7)
MCV: 93 fL (ref 79–97)
Platelets: 237 10*3/uL (ref 150–450)
RBC: 4.56 x10E6/uL (ref 4.14–5.80)
RDW: 12.6 % (ref 11.6–15.4)
WBC: 7.1 10*3/uL (ref 3.4–10.8)

## 2023-10-30 LAB — LIPID PANEL
Chol/HDL Ratio: 2.3 ratio (ref 0.0–5.0)
Cholesterol, Total: 102 mg/dL (ref 100–199)
HDL: 44 mg/dL (ref >39)
LDL Chol Calc (NIH): 44 mg/dL (ref 0–99)
Triglycerides: 60 mg/dL (ref 0–149)
VLDL Cholesterol Cal: 14 mg/dL (ref 5–40)

## 2023-10-30 LAB — PSA: Prostate Specific Ag, Serum: 3.4 ng/mL (ref 0.0–4.0)

## 2023-10-30 LAB — VITAMIN B12: Vitamin B-12: 447 pg/mL (ref 232–1245)

## 2023-10-30 LAB — VITAMIN B1: Thiamine: 119.7 nmol/L (ref 66.5–200.0)

## 2023-10-31 NOTE — Progress Notes (Signed)
B1 is normal

## 2023-11-05 ENCOUNTER — Other Ambulatory Visit: Payer: Self-pay | Admitting: Family Medicine

## 2023-11-05 ENCOUNTER — Other Ambulatory Visit: Payer: Self-pay | Admitting: Cardiology

## 2023-11-05 DIAGNOSIS — F329 Major depressive disorder, single episode, unspecified: Secondary | ICD-10-CM

## 2023-11-05 DIAGNOSIS — E7800 Pure hypercholesterolemia, unspecified: Secondary | ICD-10-CM

## 2023-11-05 DIAGNOSIS — I2510 Atherosclerotic heart disease of native coronary artery without angina pectoris: Secondary | ICD-10-CM

## 2023-11-06 NOTE — Telephone Encounter (Signed)
 Please advise on refill request

## 2023-11-18 ENCOUNTER — Other Ambulatory Visit: Payer: Self-pay | Admitting: Family Medicine

## 2023-11-18 DIAGNOSIS — G4700 Insomnia, unspecified: Secondary | ICD-10-CM

## 2023-11-20 MED ORDER — BUPROPION HCL ER (XL) 150 MG PO TB24: 150.0000 mg | ORAL_TABLET | Freq: Every day | ORAL | 1 refills | Status: DC

## 2023-11-24 ENCOUNTER — Other Ambulatory Visit (HOSPITAL_COMMUNITY)
Admission: RE | Admit: 2023-11-24 | Discharge: 2023-11-24 | Disposition: A | Discharge status: Home / Self Care | Payer: Self-pay | Source: Ambulatory Visit | Attending: Medical Genetics | Admitting: Medical Genetics

## 2023-12-04 LAB — GENECONNECT MOLECULAR SCREEN: Genetic Analysis Overall Interpretation: NEGATIVE

## 2023-12-24 ENCOUNTER — Other Ambulatory Visit: Payer: Self-pay | Admitting: Family Medicine

## 2024-01-04 ENCOUNTER — Other Ambulatory Visit: Payer: Self-pay | Admitting: Family Medicine

## 2024-01-05 ENCOUNTER — Other Ambulatory Visit: Payer: Self-pay | Admitting: Family Medicine

## 2024-01-05 MED ORDER — SILDENAFIL CITRATE 100 MG PO TABS: 50.0000 mg | ORAL_TABLET | Freq: Every day | ORAL | 3 refills | Status: DC | PRN

## 2024-01-05 NOTE — Telephone Encounter (Signed)
 Requesting rx rf of Sildenafil  100mg   Last written 03/09/2021 Last OV 10/23/2023 Upcoming appt 04/23/2024

## 2024-01-10 ENCOUNTER — Ambulatory Visit

## 2024-01-10 DIAGNOSIS — Z87891 Personal history of nicotine dependence: Secondary | ICD-10-CM | POA: Diagnosis not present

## 2024-01-10 DIAGNOSIS — Z122 Encounter for screening for malignant neoplasm of respiratory organs: Secondary | ICD-10-CM

## 2024-01-11 ENCOUNTER — Telehealth: Payer: Self-pay

## 2024-01-11 ENCOUNTER — Other Ambulatory Visit: Payer: Self-pay

## 2024-01-11 MED ORDER — SILDENAFIL CITRATE 100 MG PO TABS: 50.0000 mg | ORAL_TABLET | Freq: Every day | ORAL | 3 refills | Status: AC | PRN

## 2024-01-11 NOTE — Telephone Encounter (Signed)
 FYI- Prescription for sildenafil  cancelled at [facility] and moved to [facility] drive at pateint's request.

## 2024-01-11 NOTE — Telephone Encounter (Signed)
 Cancelled at [facility] - moved to [facility]. Also sent to prior auth team to initate PA

## 2024-01-11 NOTE — Telephone Encounter (Signed)
 Copied from CRM [id number]. Topic: Clinical - Prescription Issue >> Jan 10, 2024  3:28 PM Kevelyn M wrote: Reason for CRM: Patient is requesting sildenafil  (VIAGRA ) 100 MG tablet to be sent to a new pharmacy location because they are having issues with filling this prescription:  [facility] [address] [place], [place] [address] [phone] (Phone)

## 2024-01-23 ENCOUNTER — Other Ambulatory Visit: Payer: Self-pay

## 2024-01-23 DIAGNOSIS — Z87891 Personal history of nicotine dependence: Secondary | ICD-10-CM

## 2024-01-23 DIAGNOSIS — Z122 Encounter for screening for malignant neoplasm of respiratory organs: Secondary | ICD-10-CM

## 2024-02-29 ENCOUNTER — Ambulatory Visit (INDEPENDENT_AMBULATORY_CARE_PROVIDER_SITE_OTHER): Admitting: Family Medicine

## 2024-02-29 ENCOUNTER — Encounter: Payer: Self-pay | Admitting: Family Medicine

## 2024-02-29 VITALS — BP 118/57 | HR 69 | Ht 70.0 in | Wt 203.0 lb

## 2024-02-29 DIAGNOSIS — J432 Centrilobular emphysema: Secondary | ICD-10-CM | POA: Diagnosis not present

## 2024-02-29 DIAGNOSIS — K50919 Crohn's disease, unspecified, with unspecified complications: Secondary | ICD-10-CM

## 2024-02-29 DIAGNOSIS — Z23 Encounter for immunization: Secondary | ICD-10-CM | POA: Diagnosis not present

## 2024-02-29 DIAGNOSIS — F321 Major depressive disorder, single episode, moderate: Secondary | ICD-10-CM

## 2024-02-29 DIAGNOSIS — R7301 Impaired fasting glucose: Secondary | ICD-10-CM | POA: Diagnosis not present

## 2024-02-29 DIAGNOSIS — K5903 Drug induced constipation: Secondary | ICD-10-CM | POA: Diagnosis not present

## 2024-02-29 LAB — POCT GLYCOSYLATED HEMOGLOBIN (HGB A1C): Hemoglobin A1C: 5.8 % — AB (ref 4.0–5.6)

## 2024-02-29 MED ORDER — BUPROPION HCL ER (XL) 300 MG PO TB24: 300.0000 mg | ORAL_TABLET | Freq: Every day | ORAL | 3 refills | Status: AC

## 2024-02-29 NOTE — Progress Notes (Signed)
 Established Patient Office Visit  Subjective  Patient ID: Paul Fowler, male    DOB: 10-02-1950  Age: 73 y.o. MRN: [id number]  Chief Complaint  Patient presents with   ifg    HPI Discussed the use of AI scribe software for clinical note transcription with the patient, who gave verbal consent to proceed.  History of Present Illness Paul Fowler is a 73 year old male with prediabetes and Crohn's disease who presents for follow-up on his blood sugar levels and medication management.  Glycemic control - Prediabetes with most recent hemoglobin A1c of 5.8% (previously 5.9% in June) - Implementing dietary modifications including reduced carbohydrate intake, increased vegetable consumption, and mindful portion control - Previous success with low-carbohydrate diet for weight management - Avoids sugary drinks and snacks, prefers healthier alternatives such as almonds  Inflammatory bowel disease management - Crohn's disease with recent transition from Humira to Rinvoq, currently receiving Rinvoq at no cost - Comprehensive laboratory evaluation in August included CBC, anti-nuclear antibody, and other relevant tests - Non-fasting glucose levels elevated on recent labs - LDL cholesterol mildly elevated at 114 mg/dL  Constipation - Constipation likely related to hydromorphone  use - Daily use of combination laxative and stool softener - Concerned about long-term use of laxatives - Initiated Benefiber and considering Miralax for bowel regularity  Pulmonary health - History of lung issues with concern for possible COPD - Completed low-dose CT scan for lung cancer screening on January 10, 2024; no evidence of malignancy on report - No current tobacco use  Mood disturbance - Currently taking bupropion , requesting increase to 300 mg due to agitation at lower dose. Felt better on the higher dose.  - Higher dose previously effective for irritability     ROS    Objective:      BP (!) 118/57   Pulse 69   Ht 5' 10 (1.778 m)   Wt 203 lb (92.1 kg)   SpO2 97%   BMI 29.13 kg/m    Physical Exam Vitals and nursing note reviewed.  Constitutional:      Appearance: Normal appearance.  HENT:     Head: Normocephalic and atraumatic.  Eyes:     Conjunctiva/sclera: Conjunctivae normal.  Cardiovascular:     Rate and Rhythm: Normal rate and regular rhythm.  Pulmonary:     Effort: Pulmonary effort is normal.     Breath sounds: Wheezing present.     Comments: Wheeze right lower lung today  Skin:    General: Skin is warm and dry.  Neurological:     Mental Status: He is alert.  Psychiatric:        Mood and Affect: Mood normal.      Results for orders placed or performed in visit on 02/29/24  POCT HgB A1C  Result Value Ref Range   Hemoglobin A1C 5.8 (A) 4.0 - 5.6 %   HbA1c POC (<> result, manual entry)     HbA1c, POC (prediabetic range)     HbA1c, POC (controlled diabetic range)        The 10-year ASCVD risk score (Arnett DK, et al., 2019) is: 17.7%    Assessment & Plan:   Problem List Items Addressed This Visit       Respiratory   Centrilobular emphysema (HCC)   Emphysema with bronchiectasis and bi-basilar scarring Chest CT showed mild cylindrical bronchiectasis, centrolobular and paraseptal emphysema, bi-basilar scarring, slight wheeze in right lower lung, no lung cancer. - Schedule spirometry.  Digestive   Drug-induced constipation   Likely related to hydromorphone  use, advised against long-term laxative-stool softener use. - Use Benefiber daily with breakfast. - Use Miralax twice a week at bedtime. - Adjust Miralax frequency based on response.      Crohn's disease (HCC)    Crohn's disease Managed with Rinvoq, recent labs showed no significant abnormalities.        Endocrine   IFG (impaired fasting glucose) - Primary   A1c decreased from 5.9% to 5.8%, slight improvement but still prediabetic. - Continue dietary  modifications with portion control and reduced carbohydrates. - Encourage diet: half vegetables, quarter protein, quarter carbohydrates. - Avoid excessive carbohydrates and sugary foods. - Monitor A1c regularly.      Relevant Orders   POCT HgB A1C (Completed)     Other   Current moderate episode of major depressive disorder without prior episode (HCC)   Mild irritability, better managed with 300 mg bupropion , current prescription 150 mg. - Increase bupropion  to 300 mg daily.      Relevant Medications   buPROPion  (WELLBUTRIN  XL) 300 MG 24 hr tablet   Other Visit Diagnoses       Encounter for immunization       Relevant Orders   Flu vaccine HIGH DOSE PF(Fluzone Trivalent) (Completed)      Assessment and Plan Assessment & Plan  General Health Maintenance Confirmed up-to-date on lung cancer screening. - Encourage balanced diet, use almonds as a snack. - Advise against sugary drinks and excessive alcohol.  Follow-Up Discussed need to reschedule canceled December appointment. - Reschedule December appointment.   Return in about 6 weeks (around 04/11/2024) for spirometry for lung changes on CT scan .    Dorothyann Byars, MD

## 2024-02-29 NOTE — Assessment & Plan Note (Signed)
 Mild irritability, better managed with 300 mg bupropion , current prescription 150 mg. - Increase bupropion  to 300 mg daily.

## 2024-02-29 NOTE — Assessment & Plan Note (Signed)
 A1c decreased from 5.9% to 5.8%, slight improvement but still prediabetic. - Continue dietary modifications with portion control and reduced carbohydrates. - Encourage diet: half vegetables, quarter protein, quarter carbohydrates. - Avoid excessive carbohydrates and sugary foods. - Monitor A1c regularly.

## 2024-02-29 NOTE — Assessment & Plan Note (Signed)
 Likely related to hydromorphone  use, advised against long-term laxative-stool softener use. - Use Benefiber daily with breakfast. - Use Miralax twice a week at bedtime. - Adjust Miralax frequency based on response.

## 2024-02-29 NOTE — Assessment & Plan Note (Signed)
  Crohn's disease Managed with Rinvoq, recent labs showed no significant abnormalities.

## 2024-02-29 NOTE — Assessment & Plan Note (Signed)
 Emphysema with bronchiectasis and bi-basilar scarring Chest CT showed mild cylindrical bronchiectasis, centrolobular and paraseptal emphysema, bi-basilar scarring, slight wheeze in right lower lung, no lung cancer. - Schedule spirometry.

## 2024-04-03 ENCOUNTER — Other Ambulatory Visit: Payer: Self-pay | Admitting: Family Medicine

## 2024-04-03 DIAGNOSIS — F329 Major depressive disorder, single episode, unspecified: Secondary | ICD-10-CM

## 2024-04-18 ENCOUNTER — Ambulatory Visit

## 2024-04-23 ENCOUNTER — Ambulatory Visit: Admitting: Family Medicine

## 2024-04-23 ENCOUNTER — Ambulatory Visit

## 2024-04-29 ENCOUNTER — Encounter: Payer: Self-pay | Admitting: Family Medicine

## 2024-04-29 ENCOUNTER — Ambulatory Visit (INDEPENDENT_AMBULATORY_CARE_PROVIDER_SITE_OTHER): Admitting: Family Medicine

## 2024-04-29 VITALS — BP 122/66 | HR 57 | Resp 18 | Ht 70.0 in | Wt 203.0 lb

## 2024-04-29 DIAGNOSIS — J432 Centrilobular emphysema: Secondary | ICD-10-CM | POA: Diagnosis not present

## 2024-04-29 MED ORDER — ALBUTEROL SULFATE HFA 108 (90 BASE) MCG/ACT IN AERS: 2.0000 | INHALATION_SPRAY | Freq: Four times a day (QID) | RESPIRATORY_TRACT | 1 refills | Status: AC | PRN

## 2024-04-29 MED ORDER — IPRATROPIUM-ALBUTEROL 0.5-2.5 (3) MG/3ML IN SOLN
3.0000 mL | Freq: Once | RESPIRATORY_TRACT | Status: AC
  Administered 2024-04-29: 11:00:00 3 mL via RESPIRATORY_TRACT

## 2024-04-29 NOTE — Progress Notes (Signed)
 Established Patient Office Visit  Patient ID: Paul Fowler, male    DOB: 25-Jun-1950  Age: 73 y.o. MRN: [id number] PCP: Alvan Dorothyann BIRCH, MD  Chief Complaint  Patient presents with   Centrilobular emphysema    Spirometry: patient states he gets SOB whenever over exerting.    Subjective:     HPI  Discussed the use of AI scribe software for clinical note transcription with the patient, who gave verbal consent to proceed.  History of Present Illness Paul Fowler is a 73 year old male who presents for spirometry evaluation and management of suspected COPD.  Dyspnea on exertion - Shortness of breath during physical activities, such as hiking [place] the [place] before [place] - Becomes out of breath quickly with exertion - Interested in starting a regular walking routine to improve stamina and lung function  Pulmonary function testing - Underwent spirometry testing today and once previously a year or two ago - Initial difficulty with the test due to insufficient effort, but improved after several attempts - Pre-albuterol  spirometry capacity approximately 83% - Post-albuterol  spirometry capacity estimated at 85% - 5% improvement in lung function after albuterol  use, which was perceived as beneficial during the test  Bronchodilator response - Improved spirometry performance after albuterol  administration - Interested in using an albuterol  inhaler before exercise to improve exercise tolerance, especially during hiking  Tobacco and vaping cessation - History of smoking and vaping - Has quit both smoking and vaping for a significant period - No recent use of vaping products     ROS    Objective:     BP 122/66   Pulse (!) 57   Resp 18   Ht 5' 10 (1.778 m)   Wt 203 lb (92.1 kg)   SpO2 96%   BMI 29.13 kg/m    Physical Exam Vitals reviewed.  Constitutional:      Appearance: Normal appearance.  HENT:     Head: Normocephalic.  Pulmonary:      Effort: Pulmonary effort is normal.  Neurological:     Mental Status: He is alert and oriented to person, place, and time.  Psychiatric:        Mood and Affect: Mood normal.        Behavior: Behavior normal.      No results found for any visits on 04/29/24.    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 Lung capacity at 83% predicted, power at 86% predicted. No significant decline from previous tests. Smoking and vaping cessation beneficial. Albuterol  shows 5% improvement, ruling out asthma. Emphasized lung infection prevention and regular exercise for lung function improvement. - Prescribed albuterol  inhaler for use as needed, especially before exercise or during respiratory symptoms. - Recommended yearly flu shots and pneumonia vaccine to prevent lung infections. - Encouraged regular exercise, such as walking, to improve lung function and stamina. - Plan to repeat test in 1 year.  General Health Maintenance Emphasized importance of vaccinations and lifestyle modifications for overall health. - Recommended yearly  flu shots. - Recommended pneumonia vaccine.    Return in about 1 year (around 04/29/2025) for spirometry .    Dorothyann Byars, MD [facility] at [facility]

## 2024-05-01 ENCOUNTER — Ambulatory Visit: Admitting: Family Medicine

## 2024-05-06 NOTE — Progress Notes (Unsigned)
 This encounter was created in error - please disregard.

## 2024-08-29 ENCOUNTER — Ambulatory Visit: Admitting: Family Medicine
